# Patient Record
Sex: Male | Born: 1950 | Race: White | Hispanic: No | Marital: Married | Smoking: Former smoker
Health system: Southern US, Community
[De-identification: ages and names within clinical notes are randomized; demographics above are authoritative.]

## PROBLEM LIST (undated history)

## (undated) DIAGNOSIS — N179 Acute kidney failure, unspecified: Secondary | ICD-10-CM

## (undated) DIAGNOSIS — E079 Disorder of thyroid, unspecified: Secondary | ICD-10-CM

## (undated) DIAGNOSIS — M1990 Unspecified osteoarthritis, unspecified site: Secondary | ICD-10-CM

## (undated) DIAGNOSIS — T7840XA Allergy, unspecified, initial encounter: Secondary | ICD-10-CM

## (undated) DIAGNOSIS — R112 Nausea with vomiting, unspecified: Secondary | ICD-10-CM

## (undated) DIAGNOSIS — N289 Disorder of kidney and ureter, unspecified: Secondary | ICD-10-CM

## (undated) DIAGNOSIS — I10 Essential (primary) hypertension: Secondary | ICD-10-CM

## (undated) DIAGNOSIS — J45909 Unspecified asthma, uncomplicated: Secondary | ICD-10-CM

## (undated) DIAGNOSIS — E039 Hypothyroidism, unspecified: Secondary | ICD-10-CM

## (undated) DIAGNOSIS — G510 Bell's palsy: Secondary | ICD-10-CM

## (undated) DIAGNOSIS — T8859XA Other complications of anesthesia, initial encounter: Secondary | ICD-10-CM

## (undated) DIAGNOSIS — E119 Type 2 diabetes mellitus without complications: Secondary | ICD-10-CM

## (undated) DIAGNOSIS — Z87442 Personal history of urinary calculi: Secondary | ICD-10-CM

## (undated) DIAGNOSIS — Z8673 Personal history of transient ischemic attack (TIA), and cerebral infarction without residual deficits: Secondary | ICD-10-CM

## (undated) DIAGNOSIS — E785 Hyperlipidemia, unspecified: Secondary | ICD-10-CM

## (undated) DIAGNOSIS — Z9889 Other specified postprocedural states: Secondary | ICD-10-CM

## (undated) DIAGNOSIS — Z720 Tobacco use: Secondary | ICD-10-CM

## (undated) DIAGNOSIS — T4145XA Adverse effect of unspecified anesthetic, initial encounter: Secondary | ICD-10-CM

## (undated) HISTORY — DX: Hyperlipidemia, unspecified: E78.5

## (undated) HISTORY — PX: CHOLECYSTECTOMY: SHX55

## (undated) HISTORY — PX: CIRCUMCISION: SUR203

## (undated) HISTORY — DX: Unspecified osteoarthritis, unspecified site: M19.90

## (undated) HISTORY — PX: COLONOSCOPY: SHX174

## (undated) HISTORY — DX: Allergy, unspecified, initial encounter: T78.40XA

## (undated) HISTORY — DX: Personal history of transient ischemic attack (TIA), and cerebral infarction without residual deficits: Z86.73

## (undated) HISTORY — DX: Tobacco use: Z72.0

## (undated) HISTORY — DX: Unspecified asthma, uncomplicated: J45.909

## (undated) HISTORY — DX: Acute kidney failure, unspecified: N17.9

---

## 2014-10-22 ENCOUNTER — Inpatient Hospital Stay (HOSPITAL_COMMUNITY): Payer: BLUE CROSS/BLUE SHIELD

## 2014-10-22 ENCOUNTER — Inpatient Hospital Stay (HOSPITAL_COMMUNITY)
Admission: EM | Admit: 2014-10-22 | Discharge: 2014-10-24 | DRG: 637 | Disposition: A | Discharge status: Home / Self Care | Payer: BLUE CROSS/BLUE SHIELD | Attending: Internal Medicine | Admitting: Internal Medicine

## 2014-10-22 ENCOUNTER — Encounter (HOSPITAL_COMMUNITY): Payer: Self-pay | Admitting: *Deleted

## 2014-10-22 ENCOUNTER — Encounter (HOSPITAL_COMMUNITY): Payer: BLUE CROSS/BLUE SHIELD

## 2014-10-22 ENCOUNTER — Emergency Department (HOSPITAL_COMMUNITY): Payer: BLUE CROSS/BLUE SHIELD

## 2014-10-22 DIAGNOSIS — E871 Hypo-osmolality and hyponatremia: Secondary | ICD-10-CM | POA: Diagnosis present

## 2014-10-22 DIAGNOSIS — E1310 Other specified diabetes mellitus with ketoacidosis without coma: Secondary | ICD-10-CM | POA: Diagnosis present

## 2014-10-22 DIAGNOSIS — E875 Hyperkalemia: Secondary | ICD-10-CM | POA: Diagnosis present

## 2014-10-22 DIAGNOSIS — E0810 Diabetes mellitus due to underlying condition with ketoacidosis without coma: Secondary | ICD-10-CM

## 2014-10-22 DIAGNOSIS — E872 Acidosis, unspecified: Secondary | ICD-10-CM | POA: Insufficient documentation

## 2014-10-22 DIAGNOSIS — Z794 Long term (current) use of insulin: Secondary | ICD-10-CM

## 2014-10-22 DIAGNOSIS — N179 Acute kidney failure, unspecified: Secondary | ICD-10-CM

## 2014-10-22 DIAGNOSIS — E1110 Type 2 diabetes mellitus with ketoacidosis without coma: Secondary | ICD-10-CM | POA: Diagnosis present

## 2014-10-22 DIAGNOSIS — R739 Hyperglycemia, unspecified: Secondary | ICD-10-CM | POA: Diagnosis present

## 2014-10-22 DIAGNOSIS — Z87891 Personal history of nicotine dependence: Secondary | ICD-10-CM | POA: Diagnosis not present

## 2014-10-22 DIAGNOSIS — D473 Essential (hemorrhagic) thrombocythemia: Secondary | ICD-10-CM | POA: Diagnosis present

## 2014-10-22 DIAGNOSIS — D75839 Thrombocytosis, unspecified: Secondary | ICD-10-CM

## 2014-10-22 DIAGNOSIS — R748 Abnormal levels of other serum enzymes: Secondary | ICD-10-CM

## 2014-10-22 DIAGNOSIS — R059 Cough, unspecified: Secondary | ICD-10-CM | POA: Diagnosis present

## 2014-10-22 DIAGNOSIS — D72829 Elevated white blood cell count, unspecified: Secondary | ICD-10-CM

## 2014-10-22 DIAGNOSIS — R17 Unspecified jaundice: Secondary | ICD-10-CM

## 2014-10-22 DIAGNOSIS — R0602 Shortness of breath: Secondary | ICD-10-CM

## 2014-10-22 DIAGNOSIS — J189 Pneumonia, unspecified organism: Secondary | ICD-10-CM | POA: Diagnosis present

## 2014-10-22 DIAGNOSIS — R05 Cough: Secondary | ICD-10-CM | POA: Diagnosis present

## 2014-10-22 HISTORY — DX: Disorder of kidney and ureter, unspecified: N28.9

## 2014-10-22 HISTORY — DX: Disorder of thyroid, unspecified: E07.9

## 2014-10-22 HISTORY — DX: Type 2 diabetes mellitus without complications: E11.9

## 2014-10-22 HISTORY — DX: Acute kidney failure, unspecified: N17.9

## 2014-10-22 LAB — COMPREHENSIVE METABOLIC PANEL
ALK PHOS: 176 U/L — AB (ref 39–117)
ALT: 13 U/L (ref 0–53)
AST: 16 U/L (ref 0–37)
Albumin: 3.7 g/dL (ref 3.5–5.2)
Anion gap: 35 — ABNORMAL HIGH (ref 5–15)
BUN: 55 mg/dL — AB (ref 6–23)
CHLORIDE: 87 mmol/L — AB (ref 96–112)
CO2: 6 mmol/L — CL (ref 19–32)
CREATININE: 2.86 mg/dL — AB (ref 0.50–1.35)
Calcium: 8.8 mg/dL (ref 8.4–10.5)
GFR calc Af Amer: 26 mL/min — ABNORMAL LOW (ref >90)
GFR calc non Af Amer: 22 mL/min — ABNORMAL LOW (ref >90)
Glucose, Bld: 1092 mg/dL (ref 70–99)
POTASSIUM: 5.8 mmol/L — AB (ref 3.5–5.1)
SODIUM: 128 mmol/L — AB (ref 135–145)
TOTAL PROTEIN: 8.4 g/dL — AB (ref 6.0–8.3)
Total Bilirubin: 2.1 mg/dL — ABNORMAL HIGH (ref 0.3–1.2)

## 2014-10-22 LAB — URINE MICROSCOPIC-ADD ON

## 2014-10-22 LAB — CBC
HCT: 28.5 % — ABNORMAL LOW (ref 39.0–52.0)
HEMATOCRIT: 37.2 % — AB (ref 39.0–52.0)
HEMOGLOBIN: 9.9 g/dL — AB (ref 13.0–17.0)
Hemoglobin: 13.3 g/dL (ref 13.0–17.0)
MCH: 33.7 pg (ref 26.0–34.0)
MCH: 30.9 pg (ref 26.0–34.0)
MCHC: 35.8 g/dL (ref 30.0–36.0)
MCHC: 34.7 g/dL (ref 30.0–36.0)
MCV: 94.2 fL (ref 78.0–100.0)
MCV: 89.1 fL (ref 78.0–100.0)
PLATELETS: 305 10*3/uL (ref 150–400)
Platelets: 394 10*3/uL (ref 150–400)
RBC: 3.95 MIL/uL — ABNORMAL LOW (ref 4.22–5.81)
RBC: 3.2 MIL/uL — AB (ref 4.22–5.81)
RDW: 12.5 % (ref 11.5–15.5)
RDW: 12.6 % (ref 11.5–15.5)
WBC: 24.5 10*3/uL — ABNORMAL HIGH (ref 4.0–10.5)
WBC: 18.9 10*3/uL — AB (ref 4.0–10.5)

## 2014-10-22 LAB — URINALYSIS, ROUTINE W REFLEX MICROSCOPIC
Glucose, UA: >1000 mg/dL — AB
KETONES UR: 40 mg/dL — AB
Leukocytes, UA: NEGATIVE
Nitrite: NEGATIVE
Specific Gravity, Urine: 1.025 (ref 1.005–1.030)
Urobilinogen, UA: 0.2 mg/dL (ref 0.0–1.0)
pH: 5.5 (ref 5.0–8.0)

## 2014-10-22 LAB — BASIC METABOLIC PANEL
ANION GAP: 6 (ref 5–15)
ANION GAP: 3 — AB (ref 5–15)
Anion gap: 25 — ABNORMAL HIGH (ref 5–15)
Anion gap: 17 — ABNORMAL HIGH (ref 5–15)
Anion gap: 2 — ABNORMAL LOW (ref 5–15)
BUN: 51 mg/dL — AB (ref 6–23)
BUN: 50 mg/dL — ABNORMAL HIGH (ref 6–23)
BUN: 46 mg/dL — ABNORMAL HIGH (ref 6–23)
BUN: 40 mg/dL — AB (ref 6–23)
BUN: 31 mg/dL — ABNORMAL HIGH (ref 6–23)
CALCIUM: 8.2 mg/dL — AB (ref 8.4–10.5)
CALCIUM: 8 mg/dL — AB (ref 8.4–10.5)
CHLORIDE: 119 mmol/L — AB (ref 96–112)
CHLORIDE: 119 mmol/L — AB (ref 96–112)
CO2: 6 mmol/L — CL (ref 19–32)
CO2: 11 mmol/L — ABNORMAL LOW (ref 19–32)
CO2: 18 mmol/L — ABNORMAL LOW (ref 19–32)
CO2: 21 mmol/L (ref 19–32)
CO2: 18 mmol/L — ABNORMAL LOW (ref 19–32)
Calcium: 7.9 mg/dL — ABNORMAL LOW (ref 8.4–10.5)
Calcium: 8.2 mg/dL — ABNORMAL LOW (ref 8.4–10.5)
Calcium: 6.8 mg/dL — ABNORMAL LOW (ref 8.4–10.5)
Chloride: 106 mmol/L (ref 96–112)
Chloride: 111 mmol/L (ref 96–112)
Chloride: 121 mmol/L — ABNORMAL HIGH (ref 96–112)
Creatinine, Ser: 2.31 mg/dL — ABNORMAL HIGH (ref 0.50–1.35)
Creatinine, Ser: 1.97 mg/dL — ABNORMAL HIGH (ref 0.50–1.35)
Creatinine, Ser: 1.34 mg/dL (ref 0.50–1.35)
Creatinine, Ser: 1.05 mg/dL (ref 0.50–1.35)
Creatinine, Ser: 0.83 mg/dL (ref 0.50–1.35)
GFR calc Af Amer: 33 mL/min — ABNORMAL LOW (ref >90)
GFR calc Af Amer: 40 mL/min — ABNORMAL LOW (ref >90)
GFR calc Af Amer: 64 mL/min — ABNORMAL LOW (ref >90)
GFR calc Af Amer: 86 mL/min — ABNORMAL LOW (ref >90)
GFR calc non Af Amer: 29 mL/min — ABNORMAL LOW (ref >90)
GFR calc non Af Amer: 55 mL/min — ABNORMAL LOW (ref >90)
GFR calc non Af Amer: 74 mL/min — ABNORMAL LOW (ref >90)
GFR calc non Af Amer: >90 mL/min (ref >90)
GFR, EST NON AFRICAN AMERICAN: 35 mL/min — AB (ref >90)
GLUCOSE: 537 mg/dL — AB (ref 70–99)
GLUCOSE: 301 mg/dL — AB (ref 70–99)
Glucose, Bld: 765 mg/dL (ref 70–99)
Glucose, Bld: 157 mg/dL — ABNORMAL HIGH (ref 70–99)
Glucose, Bld: 106 mg/dL — ABNORMAL HIGH (ref 70–99)
POTASSIUM: 4.7 mmol/L (ref 3.5–5.1)
POTASSIUM: 3.9 mmol/L (ref 3.5–5.1)
POTASSIUM: 3.6 mmol/L (ref 3.5–5.1)
Potassium: 3.5 mmol/L (ref 3.5–5.1)
Potassium: 3.5 mmol/L (ref 3.5–5.1)
Sodium: 137 mmol/L (ref 135–145)
Sodium: 139 mmol/L (ref 135–145)
Sodium: 143 mmol/L (ref 135–145)
Sodium: 142 mmol/L (ref 135–145)
Sodium: 142 mmol/L (ref 135–145)

## 2014-10-22 LAB — MAGNESIUM
MAGNESIUM: 3.4 mg/dL — AB (ref 1.5–2.5)
MAGNESIUM: 2.7 mg/dL — AB (ref 1.5–2.5)

## 2014-10-22 LAB — BLOOD GAS, ARTERIAL
ACID-BASE DEFICIT: 26.5 mmol/L — AB (ref 0.0–2.0)
Bicarbonate: 2.9 mEq/L — ABNORMAL LOW (ref 20.0–24.0)
DRAWN BY: 213101
O2 Content: 2 L/min
O2 Saturation: 96.5 %
PH ART: 6.991 — AB (ref 7.350–7.450)
Patient temperature: 37
TCO2: 2.9 mmol/L (ref 0–100)
pCO2 arterial: 12.8 mmHg — CL (ref 35.0–45.0)
pO2, Arterial: 135 mmHg — ABNORMAL HIGH (ref 80.0–100.0)

## 2014-10-22 LAB — GLUCOSE, CAPILLARY
GLUCOSE-CAPILLARY: 469 mg/dL — AB (ref 70–99)
GLUCOSE-CAPILLARY: 451 mg/dL — AB (ref 70–99)
GLUCOSE-CAPILLARY: 195 mg/dL — AB (ref 70–99)
Glucose-Capillary: >600 mg/dL (ref 70–99)
Glucose-Capillary: 477 mg/dL — ABNORMAL HIGH (ref 70–99)
Glucose-Capillary: 331 mg/dL — ABNORMAL HIGH (ref 70–99)
Glucose-Capillary: 288 mg/dL — ABNORMAL HIGH (ref 70–99)
Glucose-Capillary: 230 mg/dL — ABNORMAL HIGH (ref 70–99)
Glucose-Capillary: 169 mg/dL — ABNORMAL HIGH (ref 70–99)
Glucose-Capillary: 164 mg/dL — ABNORMAL HIGH (ref 70–99)
Glucose-Capillary: 160 mg/dL — ABNORMAL HIGH (ref 70–99)
Glucose-Capillary: 124 mg/dL — ABNORMAL HIGH (ref 70–99)

## 2014-10-22 LAB — CBC WITH DIFFERENTIAL/PLATELET
BASOS ABS: 0 10*3/uL (ref 0.0–0.1)
Band Neutrophils: 0 % (ref 0–10)
Basophils Relative: 0 % (ref 0–1)
Blasts: 0 %
Eosinophils Absolute: 0 10*3/uL (ref 0.0–0.7)
Eosinophils Relative: 0 % (ref 0–5)
HEMATOCRIT: 46.5 % (ref 39.0–52.0)
Hemoglobin: 14.5 g/dL (ref 13.0–17.0)
LYMPHS ABS: 2 10*3/uL (ref 0.7–4.0)
Lymphocytes Relative: 8 % — ABNORMAL LOW (ref 12–46)
MCH: 32.8 pg (ref 26.0–34.0)
MCHC: 31.2 g/dL (ref 30.0–36.0)
MCV: 105.2 fL — ABNORMAL HIGH (ref 78.0–100.0)
MYELOCYTES: 0 %
Metamyelocytes Relative: 0 %
Monocytes Absolute: 2.3 10*3/uL — ABNORMAL HIGH (ref 0.1–1.0)
Monocytes Relative: 9 % (ref 3–12)
Neutro Abs: 21.1 10*3/uL — ABNORMAL HIGH (ref 1.7–7.7)
Neutrophils Relative %: 83 % — ABNORMAL HIGH (ref 43–77)
Platelets: 581 10*3/uL — ABNORMAL HIGH (ref 150–400)
Promyelocytes Absolute: 0 %
RBC: 4.42 MIL/uL (ref 4.22–5.81)
RDW: 13.1 % (ref 11.5–15.5)
WBC: 25.4 10*3/uL — AB (ref 4.0–10.5)
nRBC: 0 /100 WBC

## 2014-10-22 LAB — PHOSPHORUS: PHOSPHORUS: 11.2 mg/dL — AB (ref 2.3–4.6)

## 2014-10-22 LAB — INFLUENZA PANEL BY PCR (TYPE A & B)
H1N1 flu by pcr: NOT DETECTED
INFLBPCR: NEGATIVE
Influenza A By PCR: NEGATIVE

## 2014-10-22 LAB — CBG MONITORING, ED: Glucose-Capillary: >600 mg/dL (ref 70–99)

## 2014-10-22 LAB — TROPONIN I
Troponin I: <0.03 ng/mL (ref <0.031)
Troponin I: 0.12 ng/mL — ABNORMAL HIGH (ref <0.031)

## 2014-10-22 LAB — LACTIC ACID, PLASMA: LACTIC ACID, VENOUS: 6 mmol/L — AB (ref 0.5–2.0)

## 2014-10-22 LAB — LIPASE, BLOOD: Lipase: 37 U/L (ref 11–59)

## 2014-10-22 LAB — TYPE AND SCREEN
ABO/RH(D): O POS
ANTIBODY SCREEN: NEGATIVE

## 2014-10-22 LAB — MRSA PCR SCREENING: MRSA by PCR: NEGATIVE

## 2014-10-22 MED ORDER — ONDANSETRON HCL 4 MG/2ML IJ SOLN: 8.0000 mg | Freq: Four times a day (QID) | INTRAMUSCULAR | Status: DC | PRN

## 2014-10-22 MED ORDER — SODIUM CHLORIDE 0.9 % IV SOLN
80.0000 mg | Freq: Once | INTRAVENOUS | Status: AC
  Administered 2014-10-22: 80 mg via INTRAVENOUS
  Filled 2014-10-22: qty 80

## 2014-10-22 MED ORDER — PANTOPRAZOLE SODIUM 40 MG IV SOLR
INTRAVENOUS | Status: AC
Start: 2014-10-22 — End: 2014-10-22
  Filled 2014-10-22: qty 80

## 2014-10-22 MED ORDER — ENOXAPARIN SODIUM 30 MG/0.3ML ~~LOC~~ SOLN
30.0000 mg | SUBCUTANEOUS | Status: DC
  Administered 2014-10-22 – 2014-10-23 (×2): 30 mg via SUBCUTANEOUS
  Filled 2014-10-22 (×2): qty 0.3

## 2014-10-22 MED ORDER — SODIUM CHLORIDE 0.9 % IJ SOLN
10.0000 mL | Freq: Two times a day (BID) | INTRAMUSCULAR | Status: DC
  Administered 2014-10-22 – 2014-10-23 (×2): 10 mL
  Administered 2014-10-23: 20 mL

## 2014-10-22 MED ORDER — LEVOTHYROXINE SODIUM 25 MCG PO TABS
50.0000 ug | ORAL_TABLET | Freq: Every day | ORAL | Status: DC
  Administered 2014-10-22 – 2014-10-24 (×3): 50 ug via ORAL
  Filled 2014-10-22 (×3): qty 2

## 2014-10-22 MED ORDER — SODIUM CHLORIDE 0.9 % IV SOLN
INTRAVENOUS | Status: DC
  Administered 2014-10-22: 22:00:00 via INTRAVENOUS

## 2014-10-22 MED ORDER — SODIUM CHLORIDE 0.9 % IJ SOLN
10.0000 mL | INTRAMUSCULAR | Status: DC | PRN
  Administered 2014-10-22: 40 mL
  Filled 2014-10-22: qty 40

## 2014-10-22 MED ORDER — DILTIAZEM LOAD VIA INFUSION
10.0000 mg | Freq: Once | INTRAVENOUS | Status: AC
  Administered 2014-10-22: 10 mg via INTRAVENOUS
  Filled 2014-10-22: qty 10

## 2014-10-22 MED ORDER — SODIUM CHLORIDE 0.9 % IV SOLN
8.0000 mg/h | INTRAVENOUS | Status: DC
  Administered 2014-10-22 (×3): 8 mg/h via INTRAVENOUS
  Filled 2014-10-22 (×4): qty 80

## 2014-10-22 MED ORDER — DEXTROSE 5 % IV SOLN
INTRAVENOUS | Status: AC
  Filled 2014-10-22: qty 10

## 2014-10-22 MED ORDER — CEFTRIAXONE SODIUM IN DEXTROSE 20 MG/ML IV SOLN
1.0000 g | INTRAVENOUS | Status: DC
  Administered 2014-10-22 – 2014-10-23 (×2): 1 g via INTRAVENOUS
  Filled 2014-10-22 (×3): qty 50

## 2014-10-22 MED ORDER — PANTOPRAZOLE SODIUM 40 MG IV SOLR
INTRAVENOUS | Status: AC
  Filled 2014-10-22: qty 80

## 2014-10-22 MED ORDER — LEVOFLOXACIN IN D5W 750 MG/150ML IV SOLN
750.0000 mg | INTRAVENOUS | Status: DC
  Administered 2014-10-22: 750 mg via INTRAVENOUS
  Filled 2014-10-22: qty 150

## 2014-10-22 MED ORDER — PANTOPRAZOLE SODIUM 40 MG IV SOLR: 40.0000 mg | Freq: Two times a day (BID) | INTRAVENOUS | Status: DC

## 2014-10-22 MED ORDER — SODIUM CHLORIDE 0.9 % IV SOLN: INTRAVENOUS | Status: AC

## 2014-10-22 MED ORDER — ONDANSETRON 8 MG/NS 50 ML IVPB
8.0000 mg | Freq: Four times a day (QID) | INTRAVENOUS | Status: DC | PRN
Start: 2014-10-22 — End: 2014-10-24
  Filled 2014-10-22: qty 8

## 2014-10-22 MED ORDER — SODIUM CHLORIDE 0.9 % IV BOLUS (SEPSIS)
1000.0000 mL | Freq: Once | INTRAVENOUS | Status: AC
  Administered 2014-10-22: 1000 mL via INTRAVENOUS

## 2014-10-22 MED ORDER — SODIUM CHLORIDE 0.9 % IV SOLN: INTRAVENOUS | Status: DC

## 2014-10-22 MED ORDER — DEXTROSE 5 % IV SOLN
500.0000 mg | INTRAVENOUS | Status: DC
  Administered 2014-10-22 – 2014-10-23 (×2): 500 mg via INTRAVENOUS
  Filled 2014-10-22 (×3): qty 500

## 2014-10-22 MED ORDER — SODIUM CHLORIDE 0.9 % IV SOLN
1000.0000 mL | INTRAVENOUS | Status: DC
  Administered 2014-10-22: 1000 mL via INTRAVENOUS

## 2014-10-22 MED ORDER — OSELTAMIVIR PHOSPHATE 30 MG PO CAPS
30.0000 mg | ORAL_CAPSULE | Freq: Two times a day (BID) | ORAL | Status: DC
  Administered 2014-10-22 (×2): 30 mg via ORAL
  Filled 2014-10-22 (×2): qty 1

## 2014-10-22 MED ORDER — DEXTROSE 5 % IV SOLN
INTRAVENOUS | Status: DC
  Administered 2014-10-22: 16:00:00 via INTRAVENOUS

## 2014-10-22 MED ORDER — SODIUM CHLORIDE 0.9 % IV SOLN
INTRAVENOUS | Status: DC
  Filled 2014-10-22: qty 2.5

## 2014-10-22 MED ORDER — SODIUM CHLORIDE 0.9 % IV SOLN
INTRAVENOUS | Status: DC
  Administered 2014-10-22: 08:00:00 via INTRAVENOUS

## 2014-10-22 MED ORDER — DEXTROSE-NACL 5-0.45 % IV SOLN
INTRAVENOUS | Status: DC
  Administered 2014-10-22: 15:00:00 via INTRAVENOUS

## 2014-10-22 MED ORDER — INSULIN GLARGINE 100 UNIT/ML ~~LOC~~ SOLN
15.0000 [IU] | Freq: Every day | SUBCUTANEOUS | Status: DC
  Administered 2014-10-22 – 2014-10-23 (×2): 15 [IU] via SUBCUTANEOUS
  Filled 2014-10-22 (×2): qty 0.15

## 2014-10-22 MED ORDER — SODIUM CHLORIDE 0.9 % IV SOLN
1000.0000 mL | Freq: Once | INTRAVENOUS | Status: AC
  Administered 2014-10-22: 1000 mL via INTRAVENOUS

## 2014-10-22 MED ORDER — SODIUM CHLORIDE 0.9 % IV BOLUS (SEPSIS)
500.0000 mL | Freq: Once | INTRAVENOUS | Status: AC
  Administered 2014-10-22: 500 mL via INTRAVENOUS

## 2014-10-22 MED ORDER — POTASSIUM CHLORIDE 10 MEQ/100ML IV SOLN
10.0000 meq | INTRAVENOUS | Status: AC
  Administered 2014-10-22 (×5): 10 meq via INTRAVENOUS
  Filled 2014-10-22: qty 100

## 2014-10-22 MED ORDER — SODIUM CHLORIDE 0.9 % IV SOLN
INTRAVENOUS | Status: DC
  Administered 2014-10-22: 5.4 [IU]/h via INTRAVENOUS
  Administered 2014-10-22: 16.2 [IU]/h via INTRAVENOUS
  Filled 2014-10-22: qty 2.5

## 2014-10-22 MED ORDER — INSULIN ASPART 100 UNIT/ML ~~LOC~~ SOLN
0.0000 [IU] | SUBCUTANEOUS | Status: DC
  Administered 2014-10-22: 2 [IU] via SUBCUTANEOUS
  Administered 2014-10-23: 3 [IU] via SUBCUTANEOUS

## 2014-10-22 MED ORDER — SODIUM POLYSTYRENE SULFONATE 15 GM/60ML PO SUSP: 15.0000 g | Freq: Once | ORAL | Status: DC

## 2014-10-22 MED ORDER — INSULIN GLARGINE 100 UNIT/ML ~~LOC~~ SOLN: 15.0000 [IU] | Freq: Every day | SUBCUTANEOUS | Status: DC

## 2014-10-22 MED ORDER — CHLORHEXIDINE GLUCONATE 0.12 % MT SOLN
15.0000 mL | Freq: Two times a day (BID) | OROMUCOSAL | Status: DC
  Administered 2014-10-22 – 2014-10-23 (×4): 15 mL via OROMUCOSAL
  Filled 2014-10-22 (×4): qty 15

## 2014-10-22 MED ORDER — DEXTROSE 5 % IV SOLN
INTRAVENOUS | Status: AC
  Filled 2014-10-22: qty 500

## 2014-10-22 MED ORDER — DILTIAZEM HCL 100 MG IV SOLR
5.0000 mg/h | INTRAVENOUS | Status: DC
  Administered 2014-10-22 (×3): 10 mg/h via INTRAVENOUS

## 2014-10-22 MED ORDER — POTASSIUM CHLORIDE 10 MEQ/100ML IV SOLN
INTRAVENOUS | Status: AC
  Filled 2014-10-22: qty 500

## 2014-10-22 MED ORDER — SODIUM CHLORIDE 0.9 % IV SOLN
1000.0000 mL | Freq: Once | INTRAVENOUS | Status: AC
Start: 2014-10-22 — End: 2014-10-22
  Administered 2014-10-22: 1000 mL via INTRAVENOUS

## 2014-10-22 MED ORDER — CETYLPYRIDINIUM CHLORIDE 0.05 % MT LIQD
7.0000 mL | Freq: Two times a day (BID) | OROMUCOSAL | Status: DC
  Administered 2014-10-22 – 2014-10-23 (×3): 7 mL via OROMUCOSAL

## 2014-10-22 MED ORDER — ONDANSETRON HCL 4 MG/2ML IJ SOLN
INTRAMUSCULAR | Status: AC
  Administered 2014-10-22: 4 mg
  Filled 2014-10-22: qty 2

## 2014-10-22 NOTE — Progress Notes (Addendum)
[facility] PROGRESS NOTE  Filed Weights   10/22/14 0346 10/22/14 0713  Weight: 88.451 kg (195 lb) 85.7 kg (188 lb 15 oz)        Intake/Output Summary (Last 24 hours) at 10/22/14 0827 Last data filed at 10/22/14 6503  Gross per 24 hour  Intake      0 ml  Output    490 ml  Net   -490 ml     Assessment/Plan: DKA (diabetic ketoacidoses) - Blood glucose continues to be greater than 600, will continue IV insulin, decrease his IV fluids. - Still has an anion gap and his bicarbonate is 6, with no improvement from yesterday. - Continue IV fluids at 75, continue monitor strict I's and O's. - Once his blood glucoses below 250 which change solution to D5 half-normal saline.  Sinus tachycardia: - Has not improved with hydration. I'll give her another liter of normal saline again.  - If no improvement will start Diltiazem drip.  - Chest x-ray in the morning, insert triple-lumen PICC line.  Acute kidney injury: - Most likely prerenal. Continue IV fluids. - Check a basic metabolic panel in the morning. - Has had about 500 mL of urine output.  Cough/leukocytosis - Has remained afebrile, was given Levaquin on admission will change him to rocephin and azithro. Check CXR now that he is hydrated., concern about CAP with history of SOB and productive cough. - Check an influenza PCR started empirically intact through.  Hyperkalemia - DC bicarbonate push, continue IV fluids. - DC Kayexalate, his hyperkalemia is most likely due to acidosis. It seems to be correcting with hydration. - We'll need to monitor closely.   Hyponatremia: - Pseudohyponatremia due to hyperglycemia.  Nausea and vomiting question coffee-ground: -  Had an episode of vomiting here in the hospital, he was started on Zofran. He appears to be black.  - He denies any NSAIDs use except for an aspirin 81 mg.  - Started empirically on admission on Protonix infusion.  - We'll check CBCs every 12 hours and monitor  stools.  - Inserted and additional peripheral IV line.     Code Status: full Family Communication: none  Disposition Plan: inpatient   Consultants:  none  Procedures: ECHO: none  Antibiotics:  tamiflu  HPI/Subjective: He relates he feels weak, no improvement from yesterday.  Objective: Filed Vitals:   10/22/14 0713 10/22/14 0715 10/22/14 0730 10/22/14 0825  BP: 116/58 123/58 135/62   Pulse: 122 121 125   Temp:    97.7 F (36.5 C)  TempSrc:    Axillary  Resp:  20 17   Height: 5\' 9"  (1.753 m)     Weight: 85.7 kg (188 lb 15 oz)     SpO2: 99% 99% 99%      Exam:  General: Alert, awake, oriented x3, in no acute distress.  HEENT: No bruits, no goiter.  Heart: Regular rate and rhythm.  Lungs: Good air movement, clear Abdomen: Soft, nontender, nondistended, positive bowel sounds.  Neuro: Grossly intact, nonfocal.   Data Reviewed: Basic Metabolic Panel:  Recent Labs Lab 10/22/14 0410 10/22/14 0727  NA 128* 137  K 5.8* 4.7  CL 87* 106  CO2 6* 6*  GLUCOSE 1092* 765*  BUN 55* 51*  CREATININE 2.86* 2.31*  CALCIUM 8.8 7.9*  MG 3.4* 2.7*  PHOS 11.2*  --    Liver Function Tests:  Recent Labs Lab 10/22/14 0410  AST 16  ALT 13  ALKPHOS 176*  BILITOT 2.1*  PROT  8.4*  ALBUMIN 3.7    Recent Labs Lab 10/22/14 0410  LIPASE 37   No results for input(s): AMMONIA in the last 168 hours. CBC:  Recent Labs Lab 10/22/14 0410  WBC 25.4*  NEUTROABS 21.1*  HGB 14.5  HCT 46.5  MCV 105.2*  PLT 581*   Cardiac Enzymes:  Recent Labs Lab 10/22/14 0410  TROPONINI <0.03   BNP (last 3 results) No results for input(s): PROBNP in the last 8760 hours. CBG:  Recent Labs Lab 10/22/14 0355 10/22/14 0558 10/22/14 0704 10/22/14 0809  GLUCAP >600* >600* >600* >600*    No results found for this or any previous visit (from the past 240 hour(s)).   Studies: Dg Chest Port 1 View  10/22/2014   CLINICAL DATA:  Weakness, shortness of breath, fever, and  altered mental status.  EXAM: PORTABLE CHEST - 1 VIEW  COMPARISON:  None.  FINDINGS: Borderline heart size with normal pulmonary vascularity. No focal airspace disease or consolidation in the lungs. No blunting of costophrenic angles. No pneumothorax. Mediastinal contours appear intact. Calcified aorta.  IMPRESSION: Borderline heart size.  No evidence of active pulmonary disease.   Electronically Signed   By: Lucienne Capers M.D.   On: 10/22/2014 04:54    Scheduled Meds: . antiseptic oral rinse  7 mL Mouth Rinse q12n4p  . chlorhexidine  15 mL Mouth Rinse BID  . enoxaparin (LOVENOX) injection  30 mg Subcutaneous Q24H  . levothyroxine  50 mcg Oral QAC breakfast  . oseltamivir  75 mg Oral BID  . [START ON 10/25/2014] pantoprazole (PROTONIX) IV  40 mg Intravenous Q12H  . sodium polystyrene  15 g Oral Once   Continuous Infusions: . sodium chloride 1,000 mL (10/22/14 0517)  . sodium chloride    . sodium chloride 250 mL/hr at 10/22/14 0807  . dextrose 5 % and 0.45% NaCl    . insulin (NOVOLIN-R) infusion 16.2 Units/hr (10/22/14 0807)  . insulin (NOVOLIN-R) infusion    . pantoprozole (PROTONIX) infusion 8 mg/hr (10/22/14 0536)     Charlynne Cousins  [facility] Pager [phone]  If 8PM-8AM, please contact night-coverage at [email or web address], password [facility] 10/22/2014, 8:27 AM  LOS: 0 days

## 2014-10-22 NOTE — Progress Notes (Signed)
Pharmacy Note:  Tamiflu adjusted for renal fxn  Initial order for Tamiflu noted.    Estimated Creatinine Clearance: 36 mL/min (by C-G formula based on Cr of 2.31).   Allergies  Allergen Reactions  . Codeine   . Morphine And Related     Filed Vitals:   10/22/14 0825  BP:   Pulse:   Temp: 97.7 F (36.5 C)  Resp:     Anti-infectives    Start     Dose/Rate Route Frequency Ordered Stop   10/22/14 1000  oseltamivir (TAMIFLU) capsule 30 mg     30 mg Oral 2 times daily 10/22/14 0827 10/27/14 0959     Plan: Tamiflu 30mg  po BID x 5 days (adjusted for renal fxn) Monitor labs and progress  Ena Dawley, [facility] 10/22/2014 8:29 AM

## 2014-10-22 NOTE — Progress Notes (Signed)
ANTIBIOTIC CONSULT NOTE - INITIAL  Pharmacy Consult for Levaquin Indication: bronchitis  Allergies  Allergen Reactions  . Codeine   . Morphine And Related     Patient Measurements: Height: 5\' 9"  (175.3 cm) Weight: 188 lb 15 oz (85.7 kg) IBW/kg (Calculated) : 70.7  Vital Signs: Temp: 97.7 F (36.5 C) (01/31 0825) Temp Source: Axillary (01/31 0825) BP: 135/62 mmHg (01/31 0730) Pulse Rate: 125 (01/31 0730) Intake/Output from previous day: 01/30 0701 - 01/31 0700 In: -  Out: 490 [Urine:490] Intake/Output from this shift:    Labs:  Recent Labs  10/22/14 0410 10/22/14 0727  WBC 25.4*  --   HGB 14.5  --   PLT 581*  --   CREATININE 2.86* 2.31*   Estimated Creatinine Clearance: 36 mL/min (by C-G formula based on Cr of 2.31). No results for input(s): VANCOTROUGH, VANCOPEAK, VANCORANDOM, GENTTROUGH, GENTPEAK, GENTRANDOM, TOBRATROUGH, TOBRAPEAK, TOBRARND, AMIKACINPEAK, AMIKACINTROU, AMIKACIN in the last 72 hours.   Microbiology: Recent Results (from the past 720 hour(s))  Culture, blood (routine x 2)     Status: None (Preliminary result)   Collection Time: 10/22/14  4:53 AM  Result Value Ref Range Status   Specimen Description BLOOD LEFT ARM  Final   Special Requests BOTTLES DRAWN AEROBIC AND ANAEROBIC 6CC BOTTLES  Final   Culture PENDING  Incomplete   Report Status PENDING  Incomplete  Culture, blood (routine x 2)     Status: None (Preliminary result)   Collection Time: 10/22/14  5:11 AM  Result Value Ref Range Status   Specimen Description BLOOD LEFT ARM  Final   Special Requests BOTTLES DRAWN AEROBIC ONLY 6CC BOTTLE  Final   Culture PENDING  Incomplete   Report Status PENDING  Incomplete   Medical History: Past Medical History  Diagnosis Date  . Diabetes mellitus without complication   . Thyroid disease   . Renal disorder    Anti-infectives    Start     Dose/Rate Route Frequency Ordered Stop   10/22/14 1000  oseltamivir (TAMIFLU) capsule 30 mg     30 mg  Oral 2 times daily 10/22/14 0827 10/27/14 0959   10/22/14 1000  levofloxacin (LEVAQUIN) IVPB 750 mg     750 mg100 mL/hr over 90 Minutes Intravenous Every 48 hours 10/22/14 0855       Assessment: 63yo male admitted with bronchitis.  Asked to initiate Levaquin. Estimated Creatinine Clearance: 36 mL/min (by C-G formula based on Cr of 2.31).  Goal of Therapy:  Eradicate infection.  Plan:  Levaquin 750mg  IV q48hrs (renally adjusted) Monitor labs and progress.    [place] Crane, Gaylan Fauver A 10/22/2014,8:56 AM

## 2014-10-22 NOTE — ED Notes (Signed)
CRITICAL VALUE ALERT  Critical value received:  Lactic acid 6.0  Date of notification:  1/361/16  Time of notification:  0451  Critical value read back: yes  Nurse who received alert: Joyce Copa  MD notified (1st page): Roxanne Mins  Time of first page:  0451  MD notified (2nd page):  Time of second page:  Responding MD:  Roxanne Mins  Time MD responded:  [phone]

## 2014-10-22 NOTE — ED Notes (Signed)
Pt states he has been sob x 1wk. Pt lethargic and sleeping through triage.

## 2014-10-22 NOTE — ED Notes (Signed)
CRITICAL VALUE ALERT  Critical value received:  Glucose 1092 CO2 6  Date of notification:  10/22/14  Time of notification:  0451  Critical value read back: yes  Nurse who received alert:  Cheri Rous, RN  MD notified (1st page):  Roxanne Mins  Time of first page:  0451  MD notified (2nd page):  Time of second page:  Responding MD:  Roxanne Mins  Time MD responded:  [phone]

## 2014-10-22 NOTE — Progress Notes (Addendum)
Shift Event: Paged by RN to transition pt off Insulin Glucostabilizer, pt received SQ Lantus.  Upon record review, multiple CBGs in 100s, AG 2, CO2 21. Pt received 15U Lantus at 1630, 5 hours prior. Stopped Insulin gtt;  placed on SSI q 4; CBG monitoring q 4; Carb modified diet;  Kept fluids at D5 1/2 NS due to low CBGs, later switched to NS fluids at 34ml/hr  Corey Warren [facility] [phone]

## 2014-10-22 NOTE — ED Notes (Addendum)
Pt. Wife reports pt. Has not been feeling well for several days. Reports blood sugar of 198 at 2300 10/21/14. Reports giving pt. Lantus but not Novalog. Pt. Wife reports pt. With coffee ground emesis yesterday.

## 2014-10-22 NOTE — ED Notes (Signed)
Pt. More alert now. Pt. Given mouth swab to wet mouth. Pt. Asking "where am I?"

## 2014-10-22 NOTE — H&P (Signed)
Corey Warren is an 64 y.o. male.    Pcp: no local pcp [facility] physician, endocrinologist  Chief Complaint: hyperglycemia HPI: 64yo male with dm2, not taking his novolog apparently has had fever/cough since 3-4 days.  Pt is brought into ED and found to have dka and leukocytosis.    Past Medical History  Diagnosis Date  . Diabetes mellitus without complication   . Thyroid disease   . Renal disorder     Past Surgical History  Procedure Laterality Date  . Cholecystectomy      Family History  Problem Relation Age of Onset  . Family history unknown: Yes   Social History:  reports that he has quit smoking. He does not have any smokeless tobacco history on file. He reports that he does not drink alcohol or use illicit drugs.  Allergies:  Allergies  Allergen Reactions  . Codeine   . Morphine And Related    Medications reviewed  (Not in a hospital admission)  Results for orders placed or performed during the hospital encounter of 10/22/14 (from the past 48 hour(s))  CBG monitoring, ED     Status: Abnormal   Collection Time: 10/22/14  3:55 AM  Result Value Ref Range   Glucose-Capillary >600 (HH) 70 - 99 mg/dL  Comprehensive metabolic panel     Status: Abnormal   Collection Time: 10/22/14  4:10 AM  Result Value Ref Range   Sodium 128 (L) 135 - 145 mmol/L   Potassium 5.8 (H) 3.5 - 5.1 mmol/L   Chloride 87 (L) 96 - 112 mmol/L   CO2 6 (LL) 19 - 32 mmol/L    Comment: CRITICAL RESULT CALLED TO, READ BACK BY AND VERIFIED WITH:  KELLY,A @ 0451 ON 10/22/14 BY WOODIE,J    Glucose, Bld 1092 (HH) 70 - 99 mg/dL    Comment: CRITICAL RESULT CALLED TO, READ BACK BY AND VERIFIED WITH:  KELLY,A @ 0451 ON 10/22/14 BY WOODIE,J    BUN 55 (H) 6 - 23 mg/dL   Creatinine, Ser 2.86 (H) 0.50 - 1.35 mg/dL   Calcium 8.8 8.4 - 10.5 mg/dL   Total Protein 8.4 (H) 6.0 - 8.3 g/dL   Albumin 3.7 3.5 - 5.2 g/dL   AST 16 0 - 37 U/L   ALT 13 0 - 53 U/L   Alkaline Phosphatase 176 (H) 39 - 117 U/L   Total  Bilirubin 2.1 (H) 0.3 - 1.2 mg/dL   GFR calc non Af Amer 22 (L) >90 mL/min   GFR calc Af Amer 26 (L) >90 mL/min    Comment: (NOTE) The eGFR has been calculated using the CKD EPI equation. This calculation has not been validated in all clinical situations. eGFR's persistently <90 mL/min signify possible Chronic Kidney Disease.    Anion gap 35 (H) 5 - 15  Troponin I     Status: None   Collection Time: 10/22/14  4:10 AM  Result Value Ref Range   Troponin I <0.03 <0.031 ng/mL    Comment:        NO INDICATION OF MYOCARDIAL INJURY.   Lipase, blood     Status: None   Collection Time: 10/22/14  4:10 AM  Result Value Ref Range   Lipase 37 11 - 59 U/L  CBC with Differential     Status: Abnormal   Collection Time: 10/22/14  4:10 AM  Result Value Ref Range   WBC 25.4 (H) 4.0 - 10.5 K/uL   RBC 4.42 4.22 - 5.81 MIL/uL   Hemoglobin 14.5  13.0 - 17.0 g/dL   HCT 46.5 39.0 - 52.0 %   MCV 105.2 (H) 78.0 - 100.0 fL   MCH 32.8 26.0 - 34.0 pg   MCHC 31.2 30.0 - 36.0 g/dL   RDW 13.1 11.5 - 15.5 %   Platelets 581 (H) 150 - 400 K/uL   Neutrophils Relative % 83 (H) 43 - 77 %   Lymphocytes Relative 8 (L) 12 - 46 %   Monocytes Relative 9 3 - 12 %   Eosinophils Relative 0 0 - 5 %   Basophils Relative 0 0 - 1 %   Band Neutrophils 0 0 - 10 %   Metamyelocytes Relative 0 %   Myelocytes 0 %   Promyelocytes Absolute 0 %   Blasts 0 %   nRBC 0 0 /100 WBC   Neutro Abs 21.1 (H) 1.7 - 7.7 K/uL   Lymphs Abs 2.0 0.7 - 4.0 K/uL   Monocytes Absolute 2.3 (H) 0.1 - 1.0 K/uL   Eosinophils Absolute 0.0 0.0 - 0.7 K/uL   Basophils Absolute 0.0 0.0 - 0.1 K/uL   WBC Morphology ATYPICAL LYMPHOCYTES   Lactic acid, plasma     Status: Abnormal   Collection Time: 10/22/14  4:10 AM  Result Value Ref Range   Lactic Acid, Venous 6.0 (HH) 0.5 - 2.0 mmol/L    Comment: CRITICAL RESULT CALLED TO, READ BACK BY AND VERIFIED WITH:  KELLY,A @ 0450 ON 10/22/14 BY WOODIE,J   Magnesium     Status: Abnormal   Collection Time:  10/22/14  4:10 AM  Result Value Ref Range   Magnesium 3.4 (H) 1.5 - 2.5 mg/dL  Phosphorus     Status: Abnormal   Collection Time: 10/22/14  4:10 AM  Result Value Ref Range   Phosphorus 11.2 (H) 2.3 - 4.6 mg/dL  Urinalysis, Routine w reflex microscopic     Status: Abnormal   Collection Time: 10/22/14  4:28 AM  Result Value Ref Range   Color, Urine YELLOW YELLOW   APPearance CLEAR CLEAR   Specific Gravity, Urine 1.025 1.005 - 1.030   pH 5.5 5.0 - 8.0   Glucose, UA >1000 (A) NEGATIVE mg/dL   Hgb urine dipstick MODERATE (A) NEGATIVE   Bilirubin Urine SMALL (A) NEGATIVE   Ketones, ur 40 (A) NEGATIVE mg/dL   Protein, ur TRACE (A) NEGATIVE mg/dL   Urobilinogen, UA 0.2 0.0 - 1.0 mg/dL   Nitrite NEGATIVE NEGATIVE   Leukocytes, UA NEGATIVE NEGATIVE  Urine microscopic-add on     Status: None   Collection Time: 10/22/14  4:28 AM  Result Value Ref Range   Squamous Epithelial / LPF RARE RARE   WBC, UA 0-2 <3 WBC/hpf   RBC / HPF 0-2 <3 RBC/hpf   Bacteria, UA RARE RARE  Blood gas, arterial     Status: Abnormal   Collection Time: 10/22/14  4:30 AM  Result Value Ref Range   O2 Content 2.0 L/min   Delivery systems NASAL CANNULA    pH, Arterial 6.991 (LL) 7.350 - 7.450    Comment: CRITICAL RESULT CALLED TO, READ BACK BY AND VERIFIED WITH: KELLY,A.RN AT 0444 10/22/14 ANDERSON,S.RRT    pCO2 arterial 12.8 (LL) 35.0 - 45.0 mmHg    Comment: CRITICAL RESULT CALLED TO, READ BACK BY AND VERIFIED WITH: [facility] AT 0444 10/22/14 ANDERSON,S RRT    pO2, Arterial 135.0 (H) 80.0 - 100.0 mmHg   Bicarbonate 2.9 (L) 20.0 - 24.0 mEq/L   TCO2 2.9 0 - 100 mmol/L  Acid-base deficit 26.5 (H) 0.0 - 2.0 mmol/L   O2 Saturation 96.5 %   Patient temperature 37.0    Collection site RIGHT RADIAL    Drawn by [id number]    Sample type ARTERIAL    Allens test (pass/fail) PASS PASS   Dg Chest Port 1 View  10/22/2014   CLINICAL DATA:  Weakness, shortness of breath, fever, and altered mental status.  EXAM: PORTABLE  CHEST - 1 VIEW  COMPARISON:  None.  FINDINGS: Borderline heart size with normal pulmonary vascularity. No focal airspace disease or consolidation in the lungs. No blunting of costophrenic angles. No pneumothorax. Mediastinal contours appear intact. Calcified aorta.  IMPRESSION: Borderline heart size.  No evidence of active pulmonary disease.   Electronically Signed   By: Lucienne Capers M.D.   On: 10/22/2014 04:54    Review of Systems  Constitutional: Negative for fever, chills, weight loss, malaise/fatigue and diaphoresis.  HENT: Negative.   Eyes: Negative.   Respiratory: Negative.   Cardiovascular: Negative.   Gastrointestinal: Negative.   Genitourinary: Negative.   Musculoskeletal: Negative.   Skin: Negative.   Neurological: Negative.  Negative for weakness.  Endo/Heme/Allergies: Negative.   Psychiatric/Behavioral: Negative.     Blood pressure 112/57, pulse 120, temperature 97.3 F (36.3 C), temperature source Rectal, resp. rate 22, height 5' 9"  (1.753 m), weight 88.451 kg (195 lb), SpO2 100 %. Physical Exam  Constitutional: He is oriented to person, place, and time. He appears well-developed and well-nourished.  HENT:  Head: Normocephalic and atraumatic.  Eyes: Conjunctivae and EOM are normal. Pupils are equal, round, and reactive to light.  Neck: Normal range of motion. Neck supple. No JVD present. No tracheal deviation present. No thyromegaly present.  Cardiovascular: Normal rate and regular rhythm.  Exam reveals no gallop and no friction rub.   No murmur heard. Respiratory: Effort normal and breath sounds normal. No respiratory distress. He has no wheezes. He has no rales. He exhibits no tenderness.  GI: Soft. Bowel sounds are normal. He exhibits no distension and no mass. There is no tenderness. There is no rebound and no guarding.  Musculoskeletal: Normal range of motion. He exhibits no edema or tenderness.  Lymphadenopathy:    He has no cervical adenopathy.  Neurological:  He is alert and oriented to person, place, and time. He has normal reflexes. He displays normal reflexes. No cranial nerve deficit. He exhibits normal muscle tone. Coordination normal.  Skin: Skin is warm and dry. No rash noted. No erythema. No pallor.     Assessment/Plan Dka Ns iv Iv insulin  ARF Urine sodium, urine creatinine, urine eosinophils Renal ultrasound  Hyperkalemia Sodium bicarb kayexalate  Leukocytosis levaquin 55m iv qday pharmacy to dose  KJani Gravel1/31/2016, 5:51 AM

## 2014-10-22 NOTE — ED Provider Notes (Signed)
CSN: [id number]     Arrival date & time 10/22/14  5638 History   First MD Initiated Contact with Patient 10/22/14 0402     Chief Complaint  Patient presents with  . Shortness of Breath     (Consider location/radiation/quality/duration/timing/severity/associated sxs/prior Treatment) Patient is a 64 y.o. male presenting with shortness of breath. The history is provided by the spouse.  Shortness of Breath He has a history of poorly controlled insulin-dependent diabetes mellitus. For the last 3 days, he has run fevers which have been as high as 102. He started vomiting yesterday which got worse today. Today, emesis has been coffee-ground in nature. His mental state has waxed and waned but he got significantly less responsive tonight. Blood sugar was under 200 at home tonight. He has not had any fever or polyuria or polydipsia. He was not complaining of any pain. His wife has continued to give him his baseline Lantus insulin but did not give him any supplemental NovoLog because his blood sugar was not elevated.  Past Medical History  Diagnosis Date  . Diabetes mellitus without complication   . Thyroid disease   . Renal disorder    Past Surgical History  Procedure Laterality Date  . Cholecystectomy     History reviewed. No pertinent family history. History  Substance Use Topics  . Smoking status: Former Research scientist (life sciences)  . Smokeless tobacco: Not on file  . Alcohol Use: No    Review of Systems  Unable to perform ROS: Mental status change  Respiratory: Positive for shortness of breath.       Allergies  Codeine and Morphine and related  Home Medications   Prior to Admission medications   Medication Sig Start Date End Date Taking? Authorizing Provider  insulin aspart (NOVOLOG) 100 UNIT/ML injection Inject 15 Units into the skin 2 (two) times daily before a meal.   Yes Historical Provider, MD  insulin glargine (LANTUS) 100 UNIT/ML injection Inject 25 Units into the skin at bedtime.   Yes  Historical Provider, MD  levothyroxine (SYNTHROID, LEVOTHROID) 50 MCG tablet Take 50 mcg by mouth daily before breakfast.   Yes Historical Provider, MD   BP 115/75 mmHg  Pulse 121  Temp(Src) 97.3 F (36.3 C) (Rectal)  Resp 28  Ht 5\' 9"  (1.753 m)  Wt 195 lb (88.451 kg)  BMI 28.78 kg/m2  SpO2 100% Physical Exam  Nursing note and vitals reviewed.  64 year old male, poorly responsive than with Kussmaul respirations. Vital signs are significant for tachycardia and tachypnea. Oxygen saturation is 100%, which is normal. Head is normocephalic and atraumatic. PERRLA, EOMI. Oropharynx is clear. Mucous membranes are dry. Neck is nontender and supple without adenopathy or JVD. Back is nontender and there is no CVA tenderness. Lungs have faint rhonchi without rales or wheezes. Chest is nontender. Heart has regular rate and rhythm without murmur. Abdomen is soft, flat, nontender without masses or hepatosplenomegaly and peristalsis is normoactive. Extremities are somewhat mottled with no edema. Skin is warm and dry without rash. Neurologic: He is obtunded but will answer some questions and will follow some commands, cranial nerves are intact, there are no motor or sensory deficits.  ED Course  Procedures (including critical care time) Labs Review Results for orders placed or performed during the hospital encounter of 10/22/14  Comprehensive metabolic panel  Result Value Ref Range   Sodium 128 (L) 135 - 145 mmol/L   Potassium 5.8 (H) 3.5 - 5.1 mmol/L   Chloride 87 (L) 96 - 112 mmol/L  CO2 6 (LL) 19 - 32 mmol/L   Glucose, Bld 1092 (HH) 70 - 99 mg/dL   BUN 55 (H) 6 - 23 mg/dL   Creatinine, Ser 2.86 (H) 0.50 - 1.35 mg/dL   Calcium 8.8 8.4 - 10.5 mg/dL   Total Protein 8.4 (H) 6.0 - 8.3 g/dL   Albumin 3.7 3.5 - 5.2 g/dL   AST 16 0 - 37 U/L   ALT 13 0 - 53 U/L   Alkaline Phosphatase 176 (H) 39 - 117 U/L   Total Bilirubin 2.1 (H) 0.3 - 1.2 mg/dL   GFR calc non Af Amer 22 (L) >90 mL/min    GFR calc Af Amer 26 (L) >90 mL/min   Anion gap 35 (H) 5 - 15  Troponin I  Result Value Ref Range   Troponin I <0.03 <0.031 ng/mL  Lipase, blood  Result Value Ref Range   Lipase 37 11 - 59 U/L  CBC with Differential  Result Value Ref Range   WBC 25.4 (H) 4.0 - 10.5 K/uL   RBC 4.42 4.22 - 5.81 MIL/uL   Hemoglobin 14.5 13.0 - 17.0 g/dL   HCT 46.5 39.0 - 52.0 %   MCV 105.2 (H) 78.0 - 100.0 fL   MCH 32.8 26.0 - 34.0 pg   MCHC 31.2 30.0 - 36.0 g/dL   RDW 13.1 11.5 - 15.5 %   Platelets 581 (H) 150 - 400 K/uL   Neutrophils Relative % 83 (H) 43 - 77 %   Lymphocytes Relative 8 (L) 12 - 46 %   Monocytes Relative 9 3 - 12 %   Eosinophils Relative 0 0 - 5 %   Basophils Relative 0 0 - 1 %   Band Neutrophils 0 0 - 10 %   Metamyelocytes Relative 0 %   Myelocytes 0 %   Promyelocytes Absolute 0 %   Blasts 0 %   nRBC 0 0 /100 WBC   Neutro Abs 21.1 (H) 1.7 - 7.7 K/uL   Lymphs Abs 2.0 0.7 - 4.0 K/uL   Monocytes Absolute 2.3 (H) 0.1 - 1.0 K/uL   Eosinophils Absolute 0.0 0.0 - 0.7 K/uL   Basophils Absolute 0.0 0.0 - 0.1 K/uL   WBC Morphology ATYPICAL LYMPHOCYTES   Lactic acid, plasma  Result Value Ref Range   Lactic Acid, Venous 6.0 (HH) 0.5 - 2.0 mmol/L  Blood gas, arterial  Result Value Ref Range   O2 Content 2.0 L/min   Delivery systems NASAL CANNULA    pH, Arterial 6.991 (LL) 7.350 - 7.450   pCO2 arterial 12.8 (LL) 35.0 - 45.0 mmHg   pO2, Arterial 135.0 (H) 80.0 - 100.0 mmHg   Bicarbonate 2.9 (L) 20.0 - 24.0 mEq/L   TCO2 2.9 0 - 100 mmol/L   Acid-base deficit 26.5 (H) 0.0 - 2.0 mmol/L   O2 Saturation 96.5 %   Patient temperature 37.0    Collection site RIGHT RADIAL    Drawn by [id number]    Sample type ARTERIAL    Allens test (pass/fail) PASS PASS  Magnesium  Result Value Ref Range   Magnesium 3.4 (H) 1.5 - 2.5 mg/dL  Phosphorus  Result Value Ref Range   Phosphorus 11.2 (H) 2.3 - 4.6 mg/dL  Urinalysis, Routine w reflex microscopic  Result Value Ref Range   Color, Urine YELLOW  YELLOW   APPearance CLEAR CLEAR   Specific Gravity, Urine 1.025 1.005 - 1.030   pH 5.5 5.0 - 8.0   Glucose, UA >1000 (A) NEGATIVE mg/dL  Hgb urine dipstick MODERATE (A) NEGATIVE   Bilirubin Urine SMALL (A) NEGATIVE   Ketones, ur 40 (A) NEGATIVE mg/dL   Protein, ur TRACE (A) NEGATIVE mg/dL   Urobilinogen, UA 0.2 0.0 - 1.0 mg/dL   Nitrite NEGATIVE NEGATIVE   Leukocytes, UA NEGATIVE NEGATIVE  Urine microscopic-add on  Result Value Ref Range   Squamous Epithelial / LPF RARE RARE   WBC, UA 0-2 <3 WBC/hpf   RBC / HPF 0-2 <3 RBC/hpf   Bacteria, UA RARE RARE  CBG monitoring, ED  Result Value Ref Range   Glucose-Capillary >600 (HH) 70 - 99 mg/dL  CBG monitoring, ED  Result Value Ref Range   Glucose-Capillary >600 (HH) 70 - 99 mg/dL   Imaging Review Dg Chest Port 1 View  10/22/2014   CLINICAL DATA:  Weakness, shortness of breath, fever, and altered mental status.  EXAM: PORTABLE CHEST - 1 VIEW  COMPARISON:  None.  FINDINGS: Borderline heart size with normal pulmonary vascularity. No focal airspace disease or consolidation in the lungs. No blunting of costophrenic angles. No pneumothorax. Mediastinal contours appear intact. Calcified aorta.  IMPRESSION: Borderline heart size.  No evidence of active pulmonary disease.   Electronically Signed   By: Lucienne Capers M.D.   On: 10/22/2014 04:54     EKG Interpretation   Date/Time:  Sunday October 22 2014 04:01:24 EST Ventricular Rate:  122 PR Interval:  106 QRS Duration: 151 QT Interval:  349 QTC Calculation: 497 R Axis:   -72 Text Interpretation:  Sinus tachycardia RBBB and LAFB Left ventricular  hypertrophy Anterolateral infarct, age indeterminate Baseline wander in  lead(s) V1 No old tracing to compare Confirmed by [facility]  MD, Solyana Nonaka ([id number])  on 10/22/2014 4:26:45 AM     CRITICAL CARE Performed by: ZHYQM,VHQIO Total critical care time: 150 minutes Critical care time was exclusive of separately billable procedures and treating other  patients. Critical care was necessary to treat or prevent imminent or life-threatening deterioration. Critical care was time spent personally by me on the following activities: development of treatment plan with patient and/or surrogate as well as nursing, discussions with consultants, evaluation of patient's response to treatment, examination of patient, obtaining history from patient or surrogate, ordering and performing treatments and interventions, ordering and review of laboratory studies, ordering and review of radiographic studies, pulse oximetry and re-evaluation of patient's condition.  MDM   Final diagnoses:  DKA (diabetic ketoacidoses)  Acute kidney injury  Lactic acid acidosis  Hyperkalemia  Hypermagnesemia  Hyperphosphatemia  Thrombocytosis  Elevated bilirubin  Elevated alkaline phosphatase level    Diabetic ketoacidosis. Trigger appears to have been some kind of viral infection. Blood sugar is greater than 600 by CBG here. He is started on aggressive IV hydration and started on an insulin drip. With possible history of hematemesis, he is started on pantoprazole and blood is sent for type and screen. He has no old records at our institution.  Laboratory workup confirms ketoacidosis with severe acidemia and I will with pH of 6.99. Anion gap is markedly elevated at 33 but a portion of this is lactic acidosis with lactic acid of 7. Elevated BUN and creatinine are noted. Unable to access his records at [facility] and his creatinine was 0.9 on 03/07/2014. BUN was 10 at that time. Office records indicate his diabetes had been poorly controlled with hemoglobin A1c of 9 at office visit in October. Additional laboratory information shows blood sugar over thousand and calculated serum osmolality of 336. No obvious precipitating cause  is seen. No evidence of pneumonia or urinary tract infection. Hemoglobin is normal and I could not find a hemoglobin on the records from [facility].  Because of possibility of upper GI bleed, and he is started on pantoprazole drip. Case is discussed with Dr. Maudie Mercury of [facility] who agrees to admit the patient to ICU.  Delora Fuel, MD 17/49/44 [id number]

## 2014-10-22 NOTE — ED Notes (Signed)
CRITICAL VALUE ALERT  Critical value received:  Blood gas  Date of notification:  10/22/14  Time of notification:  7622  Critical value read back: yes  Nurse who received alert: Cheri Rous, RN  MD notified (1st page):  Roxanne Mins  Time of first page: [phone]  MD notified (2nd page):  Time of second page:  Responding MD:  Roxanne Mins  Time MD responded:  [phone]

## 2014-10-22 NOTE — ED Notes (Signed)
RT paged for blood gas

## 2014-10-22 NOTE — ED Notes (Signed)
AC paged for insulin drip

## 2014-10-23 ENCOUNTER — Inpatient Hospital Stay (HOSPITAL_COMMUNITY): Payer: BLUE CROSS/BLUE SHIELD

## 2014-10-23 DIAGNOSIS — R000 Tachycardia, unspecified: Secondary | ICD-10-CM

## 2014-10-23 DIAGNOSIS — J189 Pneumonia, unspecified organism: Secondary | ICD-10-CM

## 2014-10-23 LAB — BASIC METABOLIC PANEL
ANION GAP: 4 — AB (ref 5–15)
ANION GAP: 8 (ref 5–15)
Anion gap: 6 (ref 5–15)
Anion gap: 5 (ref 5–15)
Anion gap: 8 (ref 5–15)
BUN: 31 mg/dL — ABNORMAL HIGH (ref 6–23)
BUN: 27 mg/dL — ABNORMAL HIGH (ref 6–23)
BUN: 25 mg/dL — ABNORMAL HIGH (ref 6–23)
BUN: 21 mg/dL (ref 6–23)
BUN: 18 mg/dL (ref 6–23)
CHLORIDE: 116 mmol/L — AB (ref 96–112)
CHLORIDE: 111 mmol/L (ref 96–112)
CO2: 22 mmol/L (ref 19–32)
CO2: 22 mmol/L (ref 19–32)
CO2: 23 mmol/L (ref 19–32)
CO2: 24 mmol/L (ref 19–32)
CO2: 25 mmol/L (ref 19–32)
Calcium: 8.4 mg/dL (ref 8.4–10.5)
Calcium: 8.1 mg/dL — ABNORMAL LOW (ref 8.4–10.5)
Calcium: 8.7 mg/dL (ref 8.4–10.5)
Calcium: 8.4 mg/dL (ref 8.4–10.5)
Calcium: 8.4 mg/dL (ref 8.4–10.5)
Chloride: 112 mmol/L (ref 96–112)
Chloride: 109 mmol/L (ref 96–112)
Chloride: 106 mmol/L (ref 96–112)
Creatinine, Ser: 0.85 mg/dL (ref 0.50–1.35)
Creatinine, Ser: 0.89 mg/dL (ref 0.50–1.35)
Creatinine, Ser: 0.81 mg/dL (ref 0.50–1.35)
Creatinine, Ser: 0.78 mg/dL (ref 0.50–1.35)
Creatinine, Ser: 0.75 mg/dL (ref 0.50–1.35)
GFR calc Af Amer: >90 mL/min (ref >90)
GFR calc Af Amer: >90 mL/min (ref >90)
GFR calc Af Amer: >90 mL/min (ref >90)
GFR calc non Af Amer: >90 mL/min (ref >90)
GFR calc non Af Amer: 90 mL/min — ABNORMAL LOW (ref >90)
GFR calc non Af Amer: >90 mL/min (ref >90)
GFR calc non Af Amer: >90 mL/min (ref >90)
GLUCOSE: 178 mg/dL — AB (ref 70–99)
GLUCOSE: 210 mg/dL — AB (ref 70–99)
Glucose, Bld: 200 mg/dL — ABNORMAL HIGH (ref 70–99)
Glucose, Bld: 280 mg/dL — ABNORMAL HIGH (ref 70–99)
Glucose, Bld: 245 mg/dL — ABNORMAL HIGH (ref 70–99)
POTASSIUM: 3.8 mmol/L (ref 3.5–5.1)
POTASSIUM: 4 mmol/L (ref 3.5–5.1)
POTASSIUM: 4 mmol/L (ref 3.5–5.1)
POTASSIUM: 3.4 mmol/L — AB (ref 3.5–5.1)
Potassium: 3.6 mmol/L (ref 3.5–5.1)
SODIUM: 139 mmol/L (ref 135–145)
Sodium: 144 mmol/L (ref 135–145)
Sodium: 138 mmol/L (ref 135–145)
Sodium: 142 mmol/L (ref 135–145)
Sodium: 138 mmol/L (ref 135–145)

## 2014-10-23 LAB — CBC
HCT: 32.9 % — ABNORMAL LOW (ref 39.0–52.0)
HEMATOCRIT: 34.7 % — AB (ref 39.0–52.0)
HEMOGLOBIN: 12 g/dL — AB (ref 13.0–17.0)
Hemoglobin: 11.5 g/dL — ABNORMAL LOW (ref 13.0–17.0)
MCH: 31 pg (ref 26.0–34.0)
MCH: 30.7 pg (ref 26.0–34.0)
MCHC: 35 g/dL (ref 30.0–36.0)
MCHC: 34.6 g/dL (ref 30.0–36.0)
MCV: 88.7 fL (ref 78.0–100.0)
MCV: 88.7 fL (ref 78.0–100.0)
Platelets: 328 10*3/uL (ref 150–400)
Platelets: 329 10*3/uL (ref 150–400)
RBC: 3.71 MIL/uL — ABNORMAL LOW (ref 4.22–5.81)
RBC: 3.91 MIL/uL — ABNORMAL LOW (ref 4.22–5.81)
RDW: 12.7 % (ref 11.5–15.5)
RDW: 13.3 % (ref 11.5–15.5)
WBC: 20.6 10*3/uL — ABNORMAL HIGH (ref 4.0–10.5)
WBC: 15.9 10*3/uL — AB (ref 4.0–10.5)

## 2014-10-23 LAB — GLUCOSE, CAPILLARY
GLUCOSE-CAPILLARY: 105 mg/dL — AB (ref 70–99)
GLUCOSE-CAPILLARY: 153 mg/dL — AB (ref 70–99)
GLUCOSE-CAPILLARY: 171 mg/dL — AB (ref 70–99)
GLUCOSE-CAPILLARY: 177 mg/dL — AB (ref 70–99)
GLUCOSE-CAPILLARY: 74 mg/dL (ref 70–99)
Glucose-Capillary: 124 mg/dL — ABNORMAL HIGH (ref 70–99)
Glucose-Capillary: 164 mg/dL — ABNORMAL HIGH (ref 70–99)
Glucose-Capillary: 249 mg/dL — ABNORMAL HIGH (ref 70–99)
Glucose-Capillary: 234 mg/dL — ABNORMAL HIGH (ref 70–99)

## 2014-10-23 LAB — URINE CULTURE
COLONY COUNT: NO GROWTH
Culture: NO GROWTH

## 2014-10-23 LAB — MAGNESIUM: Magnesium: 2.2 mg/dL (ref 1.5–2.5)

## 2014-10-23 MED ORDER — METOPROLOL TARTRATE 25 MG PO TABS
25.0000 mg | ORAL_TABLET | Freq: Two times a day (BID) | ORAL | Status: DC
  Administered 2014-10-23 – 2014-10-24 (×3): 25 mg via ORAL
  Filled 2014-10-23 (×3): qty 1

## 2014-10-23 MED ORDER — INSULIN ASPART 100 UNIT/ML ~~LOC~~ SOLN
0.0000 [IU] | Freq: Three times a day (TID) | SUBCUTANEOUS | Status: DC
  Administered 2014-10-23 (×2): 3 [IU] via SUBCUTANEOUS
  Administered 2014-10-23: 5 [IU] via SUBCUTANEOUS
  Administered 2014-10-24: 8 [IU] via SUBCUTANEOUS
  Administered 2014-10-24: 15 [IU] via SUBCUTANEOUS

## 2014-10-23 MED ORDER — ENOXAPARIN SODIUM 40 MG/0.4ML ~~LOC~~ SOLN
40.0000 mg | SUBCUTANEOUS | Status: DC
  Administered 2014-10-24: 40 mg via SUBCUTANEOUS
  Filled 2014-10-23: qty 0.4

## 2014-10-23 MED ORDER — PANTOPRAZOLE SODIUM 40 MG PO TBEC
40.0000 mg | DELAYED_RELEASE_TABLET | Freq: Two times a day (BID) | ORAL | Status: DC
  Administered 2014-10-23 – 2014-10-24 (×3): 40 mg via ORAL
  Filled 2014-10-23 (×3): qty 1

## 2014-10-23 MED ORDER — INSULIN ASPART 100 UNIT/ML ~~LOC~~ SOLN
0.0000 [IU] | Freq: Every day | SUBCUTANEOUS | Status: DC
  Administered 2014-10-23: 3 [IU] via SUBCUTANEOUS

## 2014-10-23 MED ORDER — DEXTROSE-NACL 5-0.45 % IV SOLN
INTRAVENOUS | Status: DC
  Administered 2014-10-23: 01:00:00 via INTRAVENOUS

## 2014-10-23 MED ORDER — SODIUM CHLORIDE 0.9 % IV SOLN
INTRAVENOUS | Status: DC
  Administered 2014-10-23: 06:00:00 via INTRAVENOUS

## 2014-10-23 MED ORDER — INSULIN ASPART 100 UNIT/ML ~~LOC~~ SOLN: 0.0000 [IU] | Freq: Three times a day (TID) | SUBCUTANEOUS | Status: DC

## 2014-10-23 MED ORDER — ASPIRIN EC 81 MG PO TBEC
162.0000 mg | DELAYED_RELEASE_TABLET | Freq: Every day | ORAL | Status: DC
  Administered 2014-10-23 – 2014-10-24 (×2): 162 mg via ORAL
  Filled 2014-10-23 (×2): qty 2

## 2014-10-23 MED ORDER — INSULIN ASPART 100 UNIT/ML ~~LOC~~ SOLN
4.0000 [IU] | Freq: Three times a day (TID) | SUBCUTANEOUS | Status: DC
  Administered 2014-10-24 (×2): 4 [IU] via SUBCUTANEOUS

## 2014-10-23 NOTE — Care Management Note (Signed)
UR completed 

## 2014-10-23 NOTE — Progress Notes (Signed)
Nutrition Brief Note  Patient identified on the Malnutrition Screening Tool (MST) Report. Presented with DKA. Anion gap 4. He's being transferred out of ICU.  Wt Readings from Last 15 Encounters:  10/23/14 200 lb 2.8 oz (90.8 kg)    Body mass index is 29.55 kg/(m^2). Patient meets criteria for overweight based on current BMI. Pt reports usual weight as 193#.   Current diet order is CHO Modified, patient is consuming approximately n/a% of meals at this time. His appetite was good prior to acute illness. He is very thirsty during my visit requesting water which was provided.  He lives with spouse who shops and prepares the food for the household. He says he tries to be aware of his carb intake everyday.   Pt declines review of basic cho counting. He was encouraged to let nursing know if he has diet related questions during his admission. RD will return.  He checks his blood glucose TID and says his last HgA1C was 6.5%. Labs and medications reviewed.    Colman Cater MS,RD,CSG,LDN Office: [phone] Pager: [phone]

## 2014-10-23 NOTE — Progress Notes (Signed)
[facility] PROGRESS NOTE  Filed Weights   10/22/14 0346 10/22/14 0713 10/23/14 0500  Weight: 88.451 kg (195 lb) 85.7 kg (188 lb 15 oz) 90.8 kg (200 lb 2.8 oz)        Intake/Output Summary (Last 24 hours) at 10/23/14 0824 Last data filed at 10/23/14 0600  Gross per 24 hour  Intake 4329.25 ml  Output   2750 ml  Net 1579.25 ml     Assessment/Plan: DKA (diabetic ketoacidoses) - Anion gap close, bicarbonate of 20. He was transitioned to Lantus plus sliding scale related. - We'll stop the D5. - Continue CBGs before meals and at bedtime. - PT consult transferred to regular floor.  Community-acquired pneumonia: - Being covered with Rocephin and azithromycin. - He has remained afebrile his shortness of breath has improved, his leukocytosis is also improving. - Continue IV antibiotic transferred in to telemetry bed. - Influenza PCR negative.  Sinus tachycardia: - No improvement with hydration and diltiazem, we'll transition him to metoprolol and stop the diltiazem drip. - Check a 2-D echo.  Acute kidney injury: - Resolved with hydration also be due to prerenal etiology.  Hyperkalemia - Resolve with treatment of DKA.   Hyponatremia: - Pseudohyponatremia due to hyperglycemia. - Resolved.  Nausea and vomiting question hematemesis -  He was started on Protonix to once a day, his hemoglobin dropped from 13-11 was her was probably a component of hemodilution. - FOBT is pending has not had a bowel movement. -Continue to monitor   Status: full Family Communication: none  Disposition Plan: inpatient   Consultants:  none  Procedures: ECHO: none  Antibiotics:  tamiflu  HPI/Subjective: H Lucy feels much better.   Objective: Filed Vitals:   10/23/14 0300 10/23/14 0400 10/23/14 0500 10/23/14 0800  BP: 130/62  115/56   Pulse: 95 95 96   Temp:  98.1 F (36.7 C)  97.5 F (36.4 C)  TempSrc:  Oral  Oral  Resp: 24 24 25    Height:      Weight:   90.8 kg  (200 lb 2.8 oz)   SpO2: 94% 94% 93%      Exam:  General: Alert, awake, oriented x3, in no acute distress.  HEENT: No bruits, no goiter.  Heart: Regular rate and rhythm.  Lungs: Good air movement, clear Abdomen: Soft, nontender, nondistended, positive bowel sounds.  Neuro: Grossly intact, nonfocal.   Data Reviewed: Basic Metabolic Panel:  Recent Labs Lab 10/22/14 0410 10/22/14 0727 10/22/14 1035 10/22/14 1443 10/22/14 1921 10/22/14 2323 10/23/14 0501  NA 128* 137 139 143 142 142 144  K 5.8* 4.7 3.9 3.5 3.5 3.6 3.8  CL 87* 106 111 119* 119* 121* 116*  CO2 6* 6* 11* 18* 21 18* 22  GLUCOSE 1092* 765* 537* 301* 157* 106* 178*  BUN 55* 51* 50* 46* 40* 31* 31*  CREATININE 2.86* 2.31* 1.97* 1.34 1.05 0.83 0.85  CALCIUM 8.8 7.9* 8.2* 8.2* 8.0* 6.8* 8.4  MG 3.4* 2.7*  --   --   --   --  2.2  PHOS 11.2*  --   --   --   --   --   --    Liver Function Tests:  Recent Labs Lab 10/22/14 0410  AST 16  ALT 13  ALKPHOS 176*  BILITOT 2.1*  PROT 8.4*  ALBUMIN 3.7    Recent Labs Lab 10/22/14 0410  LIPASE 37   No results for input(s): AMMONIA in the last 168 hours. CBC:  Recent Labs Lab  10/22/14 0410 10/22/14 1211 10/22/14 2323 10/23/14 0501  WBC 25.4* 24.5* 18.9* 20.6*  NEUTROABS 21.1*  --   --   --   HGB 14.5 13.3 9.9* 11.5*  HCT 46.5 37.2* 28.5* 32.9*  MCV 105.2* 94.2 89.1 88.7  PLT 581* 394 305 328   Cardiac Enzymes:  Recent Labs Lab 10/22/14 0410 10/22/14 1443  TROPONINI <0.03 0.12*   BNP (last 3 results) No results for input(s): PROBNP in the last 8760 hours. CBG:  Recent Labs Lab 10/23/14 0108 10/23/14 0157 10/23/14 0400 10/23/14 0646 10/23/14 0731  GLUCAP 105* 124* 153* 177* 164*    Recent Results (from the past 240 hour(s))  Culture, blood (routine x 2)     Status: None (Preliminary result)   Collection Time: 10/22/14  4:53 AM  Result Value Ref Range Status   Specimen Description BLOOD LEFT ARM  Final   Special Requests BOTTLES  DRAWN AEROBIC AND ANAEROBIC 6CC BOTTLES  Final   Culture PENDING  Incomplete   Report Status PENDING  Incomplete  Culture, blood (routine x 2)     Status: None (Preliminary result)   Collection Time: 10/22/14  5:11 AM  Result Value Ref Range Status   Specimen Description BLOOD LEFT ARM  Final   Special Requests BOTTLES DRAWN AEROBIC ONLY 6CC BOTTLE  Final   Culture PENDING  Incomplete   Report Status PENDING  Incomplete  MRSA PCR Screening     Status: None   Collection Time: 10/22/14  6:55 AM  Result Value Ref Range Status   MRSA by PCR NEGATIVE NEGATIVE Final    Comment:        The GeneXpert MRSA Assay (FDA approved for NASAL specimens only), is one component of a comprehensive MRSA colonization surveillance program. It is not intended to diagnose MRSA infection nor to guide or monitor treatment for MRSA infections.      Studies: Dg Chest 1 View  10/23/2014   CLINICAL DATA:  Shortness of breath, pneumonia, altered mental status  EXAM: CHEST - 1 VIEW  COMPARISON:  Portable chest x-ray of October 22, 2014  FINDINGS: The lungs are mildly hyperinflated. There remains confluent increased airspace opacity in the inferior aspect of the right upper lobe. Minimally increased parenchymal density in the left upper lobe is noted but less conspicuous. There is no pleural effusion. The cardiac silhouette remains mildly enlarged. The pulmonary vascularity is less prominent today. The trachea is midline. The right-sided PICC line tip projects over the midportion of the SVC.  IMPRESSION: There has not been significant interval change in the appearance of the right upper lobe pneumonia since the previous study. Parenchymal density in the left upper lobe is less conspicuous today. Improving underlying pulmonary interstitial edema.   Electronically Signed   By: David  Martinique   On: 10/23/2014 07:53   Dg Chest Port 1 View  10/22/2014   CLINICAL DATA:  Right PICC placement.  Initial encounter.  EXAM:  PORTABLE CHEST - 1 VIEW  COMPARISON:  Chest radiograph performed earlier today at 4:19 p.m.  FINDINGS: The patient's right PICC is noted ending about the mid to distal SVC.  Patchy bilateral airspace opacities are again seen, more prominent on the right, concerning for multifocal pneumonia. Underlying vascular congestion is noted. No pleural effusion or pneumothorax is identified.  The cardiomediastinal silhouette is borderline normal in size. There is slight unusual prominence of the right paratracheal soft tissues. This may simply reflect normal vasculature. No acute osseous abnormalities are seen.  IMPRESSION:  1. Right PICC noted ending about the mid to distal SVC. 2. Patchy bilateral airspace opacities, more prominent on the right, concerning for multifocal pneumonia. Underlying vascular congestion seen.   Electronically Signed   By: Garald Balding M.D.   On: 10/22/2014 18:43   Dg Chest Port 1 View  10/22/2014   CLINICAL DATA:  Weakness, shortness of breath, fever, and altered mental status.  EXAM: PORTABLE CHEST - 1 VIEW  COMPARISON:  None.  FINDINGS: Borderline heart size with normal pulmonary vascularity. No focal airspace disease or consolidation in the lungs. No blunting of costophrenic angles. No pneumothorax. Mediastinal contours appear intact. Calcified aorta.  IMPRESSION: Borderline heart size.  No evidence of active pulmonary disease.   Electronically Signed   By: Lucienne Capers M.D.   On: 10/22/2014 04:54   Dg Chest Port 1v Same Day  10/22/2014   CLINICAL DATA:  Fever and difficulty breathing  EXAM: PORTABLE CHEST - 1 VIEW SAME DAY  COMPARISON:  Study obtained earlier in the day  FINDINGS: There is now patchy airspace opacity in the right upper lobe. A lesser degree of airspace opacity is noted in the left upper lobe. There is underlying emphysema. Heart is upper normal in size with pulmonary vascularity within normal limits. No adenopathy. No bone lesions.  IMPRESSION: Patchy airspace opacity  in both upper lobes, more on the right than on the left, new from earlier in the day. Underlying emphysema. No change in cardiac silhouette.   Electronically Signed   By: Lowella Grip M.D.   On: 10/22/2014 16:56    Scheduled Meds: . antiseptic oral rinse  7 mL Mouth Rinse q12n4p  . azithromycin  500 mg Intravenous Q24H  . cefTRIAXone (ROCEPHIN)  IV  1 g Intravenous Q24H  . chlorhexidine  15 mL Mouth Rinse BID  . enoxaparin (LOVENOX) injection  30 mg Subcutaneous Q24H  . insulin aspart  0-15 Units Subcutaneous 6 times per day  . insulin glargine  15 Units Subcutaneous QHS  . levofloxacin (LEVAQUIN) IV  750 mg Intravenous Q48H  . levothyroxine  50 mcg Oral QAC breakfast  . oseltamivir  30 mg Oral BID  . [START ON 10/25/2014] pantoprazole (PROTONIX) IV  40 mg Intravenous Q12H  . sodium chloride  10-40 mL Intracatheter Q12H   Continuous Infusions: . dextrose 100 mL/hr at 10/22/14 1706  . diltiazem (CARDIZEM) infusion 5 mg/hr (10/23/14 0600)  . pantoprozole (PROTONIX) infusion 8 mg/hr (10/23/14 0600)     Charlynne Cousins  [facility] Pager [phone]  If 8PM-8AM, please contact night-coverage at [email or web address], password [facility] 10/23/2014, 8:24 AM  LOS: 1 day

## 2014-10-23 NOTE — Progress Notes (Signed)
  Echocardiogram 2D Echocardiogram has been performed.  [place], Lucky 10/23/2014, 4:07 PM

## 2014-10-23 NOTE — Evaluation (Signed)
Physical Therapy Evaluation Patient Details Name: Corey Warren MRN: [id number] DOB: 09-May-1951 Today's Date: 10/23/2014   History of Present Illness  Pt is a 64 year old male who was admitted with ketoacidosis.  He is married and lives in [place], [place].  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  He states that he does not want to eat and that he has been "lying in bed for 4 days".  Clinical Impression  Pt was seen for evaluation.  He appeared to be somewhat lethargic and stated that he did not feel that he would be able to ambulate.  His strength on MMT was WNL.  He was able to transfer OOB without assist.  He has a slumped posture with forward head.  He requested to use a walker for gait and was able to ambulate 200' with good stability.  I am hoping that he will be able to transition off of the walker in the near future.  He may need HHPT at d/c.    Follow Up Recommendations Home health PT    Equipment Recommendations  None recommended by PT    Recommendations for Other Services   none    Precautions / Restrictions Precautions Precautions: Fall Restrictions Weight Bearing Restrictions: No      Mobility  Bed Mobility Overal bed mobility: Independent                Transfers Overall transfer level: Needs assistance Equipment used: None Transfers: Sit to/from Stand Sit to Stand: Supervision         General transfer comment: Pt was able to stand independently but wanted to hold onto walker for increased stability  Ambulation/Gait Ambulation/Gait assistance: Supervision Ambulation Distance (Feet): 200 Feet Assistive device: Rolling walker (2 wheeled) Gait Pattern/deviations: Trunk flexed   Gait velocity interpretation: at or above normal speed for age/gender    Stairs            Wheelchair Mobility    Modified Rankin (Stroke Patients Only)       Balance Overall balance assessment: Modified Independent                                            Pertinent Vitals/Pain Pain Assessment: No/denies pain    Home Living Family/patient expects to be discharged to:: Private residence Living Arrangements: Spouse/significant other Available Help at Discharge: Family;Available 24 hours/day Type of Home: House Home Access: Stairs to enter   [employer] of Steps: 2 Home Layout: One level Home Equipment: Walker - 2 wheels      Prior Function Level of Independence: Independent               Hand Dominance   Dominant Hand: Right    Extremity/Trunk Assessment               Lower Extremity Assessment: Generalized weakness (deconditioned)      Cervical / Trunk Assessment: Kyphotic  Communication   Communication: No difficulties  Cognition Arousal/Alertness: Lethargic Behavior During Therapy: WFL for tasks assessed/performed Overall Cognitive Status: Within Functional Limits for tasks assessed                      General Comments      Exercises        Assessment/Plan    PT Assessment Patient needs continued  PT services  PT Diagnosis Difficulty walking;Generalized weakness   PT Problem List Decreased activity tolerance;Decreased mobility  PT Treatment Interventions Gait training;Functional mobility training;Therapeutic exercise   PT Goals (Current goals can be found in the Care Plan section) Acute Rehab PT Goals Patient Stated Goal: none PT Goal Formulation: With patient Time For Goal Achievement: 11/06/14 Potential to Achieve Goals: Good    Frequency Min 3X/week   Barriers to discharge   none    Co-evaluation               End of Session Equipment Utilized During Treatment: Gait belt Activity Tolerance: Patient tolerated treatment well Patient left: in chair;with call bell/phone within reach Nurse Communication: Mobility status         Time: 1200-1232 PT Time Calculation (min) (ACUTE ONLY): 32 min   Charges:   PT  Evaluation $Initial PT Evaluation Tier I: 1 Procedure     PT G CodesDemetrios Isaacs L 10/23/2014, 12:50 PM

## 2014-10-24 DIAGNOSIS — E875 Hyperkalemia: Secondary | ICD-10-CM

## 2014-10-24 DIAGNOSIS — J189 Pneumonia, unspecified organism: Secondary | ICD-10-CM

## 2014-10-24 DIAGNOSIS — E871 Hypo-osmolality and hyponatremia: Secondary | ICD-10-CM

## 2014-10-24 LAB — CBC
HCT: 33.5 % — ABNORMAL LOW (ref 39.0–52.0)
HEMOGLOBIN: 11.5 g/dL — AB (ref 13.0–17.0)
MCH: 31.5 pg (ref 26.0–34.0)
MCHC: 34.3 g/dL (ref 30.0–36.0)
MCV: 91.8 fL (ref 78.0–100.0)
PLATELETS: 324 10*3/uL (ref 150–400)
RBC: 3.65 MIL/uL — ABNORMAL LOW (ref 4.22–5.81)
RDW: 13.3 % (ref 11.5–15.5)
WBC: 10.8 10*3/uL — ABNORMAL HIGH (ref 4.0–10.5)

## 2014-10-24 LAB — BASIC METABOLIC PANEL
Anion gap: 5 (ref 5–15)
Anion gap: 8 (ref 5–15)
BUN: 15 mg/dL (ref 6–23)
BUN: 16 mg/dL (ref 6–23)
CHLORIDE: 104 mmol/L (ref 96–112)
CO2: 27 mmol/L (ref 19–32)
CO2: 26 mmol/L (ref 19–32)
Calcium: 8.1 mg/dL — ABNORMAL LOW (ref 8.4–10.5)
Calcium: 8.3 mg/dL — ABNORMAL LOW (ref 8.4–10.5)
Chloride: 106 mmol/L (ref 96–112)
Creatinine, Ser: 0.72 mg/dL (ref 0.50–1.35)
Creatinine, Ser: 0.68 mg/dL (ref 0.50–1.35)
GFR calc Af Amer: >90 mL/min (ref >90)
GFR calc Af Amer: >90 mL/min (ref >90)
GFR calc non Af Amer: >90 mL/min (ref >90)
GLUCOSE: 195 mg/dL — AB (ref 70–99)
Glucose, Bld: 249 mg/dL — ABNORMAL HIGH (ref 70–99)
Potassium: 3.4 mmol/L — ABNORMAL LOW (ref 3.5–5.1)
Potassium: 3.5 mmol/L (ref 3.5–5.1)
SODIUM: 138 mmol/L (ref 135–145)
Sodium: 138 mmol/L (ref 135–145)

## 2014-10-24 LAB — GLUCOSE, CAPILLARY
GLUCOSE-CAPILLARY: 251 mg/dL — AB (ref 70–99)
GLUCOSE-CAPILLARY: 351 mg/dL — AB (ref 70–99)

## 2014-10-24 MED ORDER — METOPROLOL TARTRATE 25 MG PO TABS: 25.0000 mg | ORAL_TABLET | Freq: Two times a day (BID) | ORAL | Status: DC

## 2014-10-24 MED ORDER — GUAIFENESIN ER 600 MG PO TB12: 600.0000 mg | ORAL_TABLET | Freq: Two times a day (BID) | ORAL | Status: DC

## 2014-10-24 MED ORDER — LEVOFLOXACIN 750 MG PO TABS: 750.0000 mg | ORAL_TABLET | Freq: Every day | ORAL | Status: DC

## 2014-10-24 MED ORDER — INSULIN GLARGINE 100 UNIT/ML ~~LOC~~ SOLN
18.0000 [IU] | Freq: Every day | SUBCUTANEOUS | Status: DC
  Filled 2014-10-24: qty 0.18

## 2014-10-24 NOTE — Progress Notes (Signed)
Wife came to take patient home, went over discharge instruction and taken down to car via wheelchair.

## 2014-10-24 NOTE — Care Management Note (Addendum)
    Page 1 of 1   10/24/2014     1:33:57 PM CARE MANAGEMENT NOTE 10/24/2014  Patient:  Corey Warren, Corey Warren   Account Number:  [id number]  Date Initiated:  10/24/2014  Documentation initiated by:  Jolene Provost  Subjective/Objective Assessment:   Pt is from home, lives with wife and has worked for the [employer] for 22 years. Pt is independent at baseline and has no HH services, DME's or med needs prior to admission. Pt has no PCP but goes to [facility] for Endocrinology services.     Action/Plan:   The pt states he would like to talk to the financial counselor about getting long term disability because "he just cant keep doing this". Pt plans to discharge home with self care. Consult made for the fin. counselor. List of PCP's given.   Anticipated DC Date:  10/27/2014   Anticipated DC Plan:  HOME/SELF CARE  In-house referral  Financial Counselor      DC Planning Services  CM consult      Choice offered to / List presented to:             Status of service:  Completed, signed off Medicare Important Message given?   (If response is "NO", the following Medicare IM given date fields will be blank) Date Medicare IM given:   Medicare IM given by:   Date Additional Medicare IM given:   Additional Medicare IM given by:    Discharge Disposition:  HOME/SELF CARE  Per UR Regulation:    If discussed at Long Length of Stay Meetings, dates discussed:    Comments:  10/24/2014 [place], RN, MSN, CM Pt given list of area PCP's and endocrinologist accepting new pt's. Pt will follow up with endocrinologist at [facility]. Pt has no further CM needs at this time. 10/24/2014 0700 Jolene Provost, RN, MSN, CM Will continue to follow for CM needs.

## 2014-10-24 NOTE — Progress Notes (Signed)
PT IS WAITING FOR WIFE TO COME BACK BEFORE RECEIVING DISCHARGE INSTRUCTIONS. PICC LINE AND IV D/C'D. PT IS WAITING TO EAT LUNCH ALSO WHEN WIFE RETURNS.PT DEPRESSED AND SAD.

## 2014-10-24 NOTE — Progress Notes (Addendum)
Inpatient Diabetes Program Recommendations  AACE/ADA: New Consensus Statement on Inpatient Glycemic Control (2013)  Target Ranges:  Prepandial:   less than 140 mg/dL      Peak postprandial:   less than 180 mg/dL (1-2 hours)      Critically ill patients:  140 - 180 mg/dL   Results for Corey Warren, Corey Warren (MRN [id number]) as of 10/24/2014 08:21  Ref. Range 10/23/2014 07:31 10/23/2014 11:24 10/23/2014 16:33 10/23/2014 20:09 10/24/2014 07:36  Glucose-Capillary Latest Range: 70-99 mg/dL 164 (H) 171 (H) 249 (H) 234 (H) 251 (H)   Diabetes history: DM2 Outpatient Diabetes medications: Lantus 25 units daily, Novolog 20 units QAM with meal, Novolog 15 units QPM with meal Current orders for Inpatient glycemic control: Lantus 15 units QHS, Novolog 0-15 units TID with meals, Novolog 0-5 units QHS, Novolog 4 units TID with meals for meal coverage.  Inpatient Diabetes Program Recommendations Insulin - Basal: Please consider increasing Lantus to 18 units QHS. Insulin - Meal Coverage: Noted Novolog meal coverage was ordered yesterday and none was given until this morning with breakfast.  Thanks, Barnie Alderman, RN, MSN, CCRN, CDE Diabetes Coordinator Inpatient Diabetes Program [phone] (Team Pager) [phone] (AP office) [phone] [facility] office)

## 2014-10-24 NOTE — Discharge Summary (Addendum)
Physician Discharge Summary  Sol Englert ELF:[id number] DOB: 06-17-1951 DOA: 10/22/2014  PCP: No primary care provider on file.  Admit date: 10/22/2014 Discharge date: 10/24/2014  Time spent: 45 minutes  Recommendations for Outpatient Follow-up:  -Will be discharged home today. -Advised to follow up with PCP in 2 weeks. -Would recommend repeat CXR in 4-6 weeks to ensure complete resolution of PNA.   Discharge Diagnoses:  Principal Problem:   DKA (diabetic ketoacidoses) Active Problems:   CAP (community acquired pneumonia)   Cough   Hyperkalemia   Acute renal failure   Hyponatremia   Leukocytosis   Elevated bilirubin   Lactic acid acidosis   Discharge Condition: Stable and improved  Filed Weights   10/22/14 0713 10/23/14 0500 10/24/14 0530  Weight: 85.7 kg (188 lb 15 oz) 90.8 kg (200 lb 2.8 oz) 90.8 kg (200 lb 2.8 oz)    History of present illness:  64yo male with dm2, not taking his novolog apparently has had fever/cough since 3-4 days. Pt is brought into ED and found to have dka and leukocytosis.   Hospital Course:   DKA -Resolved. -Suspect precipitated by PNA. -Continue home doses of lantus and novolog. -Advised to keep a CBG log and bring to next appointment with PCP.  CAP -Still with mild cough. -Levaquin for 10 days on DC. -Leukocytosis resolved. -Would repeat CXR in 4 -6 weeks to ensure complete resolution.  Sinus Tachycardia -Suspect related to acute illness. -Has been started on metoprolol and tolerating well.  Hyperkalemia -Resolved with treatment of DKA.  Hyponatremia -Resolved, likely psuedo related to hyperglycemia.  Procedures:  None   Consultations:  None  Discharge Instructions      Discharge Instructions    Diet Carb Modified    Complete by:  As directed      Increase activity slowly    Complete by:  As directed             Medication List    TAKE these medications        aspirin EC 81 MG tablet  Take 162 mg by  mouth daily.     guaiFENesin 600 MG 12 hr tablet  Commonly known as:  MUCINEX  Take 1 tablet (600 mg total) by mouth 2 (two) times daily.     insulin aspart 100 UNIT/ML injection  Commonly known as:  novoLOG  Inject 15-20 Units into the skin 2 (two) times daily. 20 units in the morning and 15 units at night.     insulin glargine 100 UNIT/ML injection  Commonly known as:  LANTUS  Inject 25 Units into the skin daily.     levofloxacin 750 MG tablet  Commonly known as:  LEVAQUIN  Take 1 tablet (750 mg total) by mouth daily.     levothyroxine 50 MCG tablet  Commonly known as:  SYNTHROID, LEVOTHROID  Take 50 mcg by mouth daily before breakfast.     metoprolol tartrate 25 MG tablet  Commonly known as:  LOPRESSOR  Take 1 tablet (25 mg total) by mouth 2 (two) times daily.       Allergies  Allergen Reactions  . Codeine Nausea And Vomiting  . Morphine And Related Nausea And Vomiting   Follow-up Information    Schedule an appointment as soon as possible for a visit in 2 weeks to follow up.   Why:  With your regular physician       The results of significant diagnostics from this hospitalization (including imaging, microbiology, ancillary and  laboratory) are listed below for reference.    Significant Diagnostic Studies: Dg Chest 1 View  10/23/2014   CLINICAL DATA:  Shortness of breath, pneumonia, altered mental status  EXAM: CHEST - 1 VIEW  COMPARISON:  Portable chest x-ray of October 22, 2014  FINDINGS: The lungs are mildly hyperinflated. There remains confluent increased airspace opacity in the inferior aspect of the right upper lobe. Minimally increased parenchymal density in the left upper lobe is noted but less conspicuous. There is no pleural effusion. The cardiac silhouette remains mildly enlarged. The pulmonary vascularity is less prominent today. The trachea is midline. The right-sided PICC line tip projects over the midportion of the SVC.  IMPRESSION: There has not been  significant interval change in the appearance of the right upper lobe pneumonia since the previous study. Parenchymal density in the left upper lobe is less conspicuous today. Improving underlying pulmonary interstitial edema.   Electronically Signed   By: David  Martinique   On: 10/23/2014 07:53   Dg Chest Port 1 View  10/22/2014   CLINICAL DATA:  Right PICC placement.  Initial encounter.  EXAM: PORTABLE CHEST - 1 VIEW  COMPARISON:  Chest radiograph performed earlier today at 4:19 p.m.  FINDINGS: The patient's right PICC is noted ending about the mid to distal SVC.  Patchy bilateral airspace opacities are again seen, more prominent on the right, concerning for multifocal pneumonia. Underlying vascular congestion is noted. No pleural effusion or pneumothorax is identified.  The cardiomediastinal silhouette is borderline normal in size. There is slight unusual prominence of the right paratracheal soft tissues. This may simply reflect normal vasculature. No acute osseous abnormalities are seen.  IMPRESSION: 1. Right PICC noted ending about the mid to distal SVC. 2. Patchy bilateral airspace opacities, more prominent on the right, concerning for multifocal pneumonia. Underlying vascular congestion seen.   Electronically Signed   By: Garald Balding M.D.   On: 10/22/2014 18:43   Dg Chest Port 1 View  10/22/2014   CLINICAL DATA:  Weakness, shortness of breath, fever, and altered mental status.  EXAM: PORTABLE CHEST - 1 VIEW  COMPARISON:  None.  FINDINGS: Borderline heart size with normal pulmonary vascularity. No focal airspace disease or consolidation in the lungs. No blunting of costophrenic angles. No pneumothorax. Mediastinal contours appear intact. Calcified aorta.  IMPRESSION: Borderline heart size.  No evidence of active pulmonary disease.   Electronically Signed   By: Lucienne Capers M.D.   On: 10/22/2014 04:54   Dg Chest Port 1v Same Day  10/22/2014   CLINICAL DATA:  Fever and difficulty breathing  EXAM:  PORTABLE CHEST - 1 VIEW SAME DAY  COMPARISON:  Study obtained earlier in the day  FINDINGS: There is now patchy airspace opacity in the right upper lobe. A lesser degree of airspace opacity is noted in the left upper lobe. There is underlying emphysema. Heart is upper normal in size with pulmonary vascularity within normal limits. No adenopathy. No bone lesions.  IMPRESSION: Patchy airspace opacity in both upper lobes, more on the right than on the left, new from earlier in the day. Underlying emphysema. No change in cardiac silhouette.   Electronically Signed   By: Lowella Grip M.D.   On: 10/22/2014 16:56    Microbiology: Recent Results (from the past 240 hour(s))  Urine culture     Status: None   Collection Time: 10/22/14  4:28 AM  Result Value Ref Range Status   Specimen Description URINE, CLEAN CATCH  Final  Special Requests NONE  Final   Colony Count NO GROWTH Performed at [facility]   Final   Culture NO GROWTH Performed at [facility]   Final   Report Status 10/23/2014 FINAL  Final  Culture, blood (routine x 2)     Status: None (Preliminary result)   Collection Time: 10/22/14  4:53 AM  Result Value Ref Range Status   Specimen Description BLOOD LEFT ARM  Final   Special Requests BOTTLES DRAWN AEROBIC AND ANAEROBIC 6CC BOTTLES  Final   Culture NO GROWTH 2 DAYS  Final   Report Status PENDING  Incomplete  Culture, blood (routine x 2)     Status: None (Preliminary result)   Collection Time: 10/22/14  5:11 AM  Result Value Ref Range Status   Specimen Description BLOOD LEFT ARM  Final   Special Requests BOTTLES DRAWN AEROBIC ONLY 6CC BOTTLE  Final   Culture NO GROWTH 2 DAYS  Final   Report Status PENDING  Incomplete  MRSA PCR Screening     Status: None   Collection Time: 10/22/14  6:55 AM  Result Value Ref Range Status   MRSA by PCR NEGATIVE NEGATIVE Final    Comment:        The GeneXpert MRSA Assay (FDA approved for NASAL specimens only), is one  component of a comprehensive MRSA colonization surveillance program. It is not intended to diagnose MRSA infection nor to guide or monitor treatment for MRSA infections.      Labs: Basic Metabolic Panel:  Recent Labs Lab 10/22/14 0410 10/22/14 0727  10/23/14 0501  10/23/14 1303 10/23/14 1722 10/23/14 2048 10/24/14 0100 10/24/14 0415  NA 128* 137  < > 144  < > 142 138 139 138 138  K 5.8* 4.7  < > 3.8  < > 4.0 4.0 3.4* 3.4* 3.5  CL 87* 106  < > 116*  < > 111 109 106 106 104  CO2 6* 6*  < > 22  < > 23 24 25 27 26   GLUCOSE 1092* 765*  < > 178*  < > 210* 280* 245* 195* 249*  BUN 55* 51*  < > 31*  < > 25* 21 18 15 16   CREATININE 2.86* 2.31*  < > 0.85  < > 0.81 0.78 0.75 0.72 0.68  CALCIUM 8.8 7.9*  < > 8.4  < > 8.7 8.4 8.4 8.1* 8.3*  MG 3.4* 2.7*  --  2.2  --   --   --   --   --   --   PHOS 11.2*  --   --   --   --   --   --   --   --   --   < > = values in this interval not displayed. Liver Function Tests:  Recent Labs Lab 10/22/14 0410  AST 16  ALT 13  ALKPHOS 176*  BILITOT 2.1*  PROT 8.4*  ALBUMIN 3.7    Recent Labs Lab 10/22/14 0410  LIPASE 37   No results for input(s): AMMONIA in the last 168 hours. CBC:  Recent Labs Lab 10/22/14 0410 10/22/14 1211 10/22/14 2323 10/23/14 0501 10/23/14 1722 10/24/14 0415  WBC 25.4* 24.5* 18.9* 20.6* 15.9* 10.8*  NEUTROABS 21.1*  --   --   --   --   --   HGB 14.5 13.3 9.9* 11.5* 12.0* 11.5*  HCT 46.5 37.2* 28.5* 32.9* 34.7* 33.5*  MCV 105.2* 94.2 89.1 88.7 88.7 91.8  PLT 581* 394 305 328  329 324   Cardiac Enzymes:  Recent Labs Lab 10/22/14 0410 10/22/14 1443  TROPONINI <0.03 0.12*   BNP: BNP (last 3 results) No results for input(s): BNP in the last 8760 hours.  ProBNP (last 3 results) No results for input(s): PROBNP in the last 8760 hours.  CBG:  Recent Labs Lab 10/23/14 0731 10/23/14 1124 10/23/14 1633 10/23/14 2009 10/24/14 0736  GLUCAP 164* 171* 249* 234* 251*        Signed:  [facility] Pager: [phone] 10/24/2014, 11:14 AM

## 2014-10-24 NOTE — Progress Notes (Signed)
Physical Therapy Treatment Patient Details Name: Corey Warren MRN: [id number] DOB: Aug 11, 1951 Today's Date: 2014/11/09    History of Present Illness Pt is a 64 year old male who was admitted with ketoacidosis.  He is married and lives in [place], [place].  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  He states that he does not want to eat and that he has been "lying in bed for 4 days".    PT Comments    Patient is doing very well today, no longer need walker for gait. He ambulated functional distances on level surface and stair with modified independence. No further PT needed.  Follow Up Recommendations  No PT follow up     Equipment Recommendations  None recommended by PT    Recommendations for Other Services  none     Precautions / Restrictions Precautions Precautions: None Restrictions Weight Bearing Restrictions: No    Mobility  Bed Mobility Overal bed mobility: Independent                Transfers Overall transfer level: Independent                  Ambulation/Gait Ambulation/Gait assistance: Modified independent (Device/Increase time) Ambulation Distance (Feet): 400 Feet Assistive device: None Gait Pattern/deviations: WFL(Within Functional Limits)   Gait velocity interpretation: Below normal speed for age/gender     Stairs Stairs: Yes Stairs assistance: Modified independent (Device/Increase time) Stair Management: One rail Right;Alternating pattern;Forwards Number of Stairs: 10    Wheelchair Mobility    Modified Rankin (Stroke Patients Only)       Balance Overall balance assessment: Modified Independent                                  Cognition Arousal/Alertness: Awake/alert Behavior During Therapy: WFL for tasks assessed/performed Overall Cognitive Status: Within Functional Limits for tasks assessed                      Exercises      General Comments        Pertinent  Vitals/Pain Pain Assessment: No/denies pain    Home Living                      Prior Function            PT Goals (current goals can now be found in the care plan section) Progress towards PT goals: Goals met/education completed, patient discharged from PT    Frequency       PT Plan Other (comment) (Discharge from PT)    Co-evaluation             End of Session Equipment Utilized During Treatment: Gait belt Activity Tolerance: Patient tolerated treatment well Patient left: in chair;with call bell/phone within reach;with family/visitor present     Time: 2536-6440 PT Time Calculation (min) (ACUTE ONLY): 19 min  Charges:  $Gait Training: 8-22 mins                    G Codes:      Sable Feil 2014-11-09, 10:23 AM

## 2014-10-24 NOTE — Progress Notes (Signed)
PHARMACIST - PHYSICIAN COMMUNICATION DR:   Jerilee Hoh CONCERNING: Antibiotic IV to Oral Route Change Policy  RECOMMENDATION: This patient is receiving Zithromax by the intravenous route.  Based on criteria approved by the Pharmacy and Therapeutics Committee, the antibiotic(s) is/are being converted to the equivalent oral dose form(s).   DESCRIPTION: These criteria include:  Patient being treated for a respiratory tract infection, urinary tract infection, cellulitis or clostridium difficile associated diarrhea if on metronidazole  The patient is not neutropenic and does not exhibit a GI malabsorption state  The patient is eating (either orally or via tube) and/or has been taking other orally administered medications for a least 24 hours  The patient is improving clinically and has a Tmax < 100.5  If you have questions about this conversion, please contact the Pharmacy Department  [x]   [phone] )  [facility] []   [phone] )  [facility]  []   [phone] )  [facility] []   [phone] )  [place], PharmD, BCPS 10/24/2014@11 :08 AM

## 2014-10-28 LAB — CULTURE, BLOOD (ROUTINE X 2)
CULTURE: NO GROWTH
Culture: NO GROWTH

## 2014-12-25 ENCOUNTER — Emergency Department (HOSPITAL_COMMUNITY)
Admission: EM | Admit: 2014-12-25 | Discharge: 2014-12-25 | Disposition: A | Discharge status: Home / Self Care | Payer: BLUE CROSS/BLUE SHIELD | Attending: Emergency Medicine | Admitting: Emergency Medicine

## 2014-12-25 ENCOUNTER — Encounter (HOSPITAL_COMMUNITY): Payer: Self-pay | Admitting: Emergency Medicine

## 2014-12-25 DIAGNOSIS — E079 Disorder of thyroid, unspecified: Secondary | ICD-10-CM | POA: Diagnosis not present

## 2014-12-25 DIAGNOSIS — Z87891 Personal history of nicotine dependence: Secondary | ICD-10-CM | POA: Diagnosis not present

## 2014-12-25 DIAGNOSIS — M25531 Pain in right wrist: Secondary | ICD-10-CM | POA: Insufficient documentation

## 2014-12-25 DIAGNOSIS — Z792 Long term (current) use of antibiotics: Secondary | ICD-10-CM | POA: Insufficient documentation

## 2014-12-25 DIAGNOSIS — R509 Fever, unspecified: Secondary | ICD-10-CM | POA: Diagnosis not present

## 2014-12-25 DIAGNOSIS — M7989 Other specified soft tissue disorders: Secondary | ICD-10-CM | POA: Diagnosis present

## 2014-12-25 DIAGNOSIS — M791 Myalgia: Secondary | ICD-10-CM | POA: Insufficient documentation

## 2014-12-25 DIAGNOSIS — Z79899 Other long term (current) drug therapy: Secondary | ICD-10-CM | POA: Insufficient documentation

## 2014-12-25 DIAGNOSIS — Z7982 Long term (current) use of aspirin: Secondary | ICD-10-CM | POA: Insufficient documentation

## 2014-12-25 DIAGNOSIS — E119 Type 2 diabetes mellitus without complications: Secondary | ICD-10-CM | POA: Insufficient documentation

## 2014-12-25 DIAGNOSIS — M25432 Effusion, left wrist: Secondary | ICD-10-CM | POA: Insufficient documentation

## 2014-12-25 DIAGNOSIS — Z794 Long term (current) use of insulin: Secondary | ICD-10-CM | POA: Diagnosis not present

## 2014-12-25 DIAGNOSIS — R61 Generalized hyperhidrosis: Secondary | ICD-10-CM | POA: Diagnosis not present

## 2014-12-25 DIAGNOSIS — Z87448 Personal history of other diseases of urinary system: Secondary | ICD-10-CM | POA: Insufficient documentation

## 2014-12-25 LAB — CBC WITH DIFFERENTIAL/PLATELET
Basophils Absolute: 0 10*3/uL (ref 0.0–0.1)
Basophils Relative: 1 % (ref 0–1)
Eosinophils Absolute: 0.1 10*3/uL (ref 0.0–0.7)
Eosinophils Relative: 2 % (ref 0–5)
HEMATOCRIT: 41.5 % (ref 39.0–52.0)
HEMOGLOBIN: 14 g/dL (ref 13.0–17.0)
LYMPHS ABS: 1.2 10*3/uL (ref 0.7–4.0)
LYMPHS PCT: 17 % (ref 12–46)
MCH: 30.7 pg (ref 26.0–34.0)
MCHC: 33.7 g/dL (ref 30.0–36.0)
MCV: 91 fL (ref 78.0–100.0)
MONOS PCT: 10 % (ref 3–12)
Monocytes Absolute: 0.7 10*3/uL (ref 0.1–1.0)
Neutro Abs: 5 10*3/uL (ref 1.7–7.7)
Neutrophils Relative %: 70 % (ref 43–77)
Platelets: 327 10*3/uL (ref 150–400)
RBC: 4.56 MIL/uL (ref 4.22–5.81)
RDW: 13.1 % (ref 11.5–15.5)
WBC: 7 10*3/uL (ref 4.0–10.5)

## 2014-12-25 LAB — BASIC METABOLIC PANEL
Anion gap: 9 (ref 5–15)
BUN: 7 mg/dL (ref 6–23)
CALCIUM: 9.1 mg/dL (ref 8.4–10.5)
CO2: 29 mmol/L (ref 19–32)
Chloride: 92 mmol/L — ABNORMAL LOW (ref 96–112)
Creatinine, Ser: 0.83 mg/dL (ref 0.50–1.35)
GFR calc Af Amer: >90 mL/min (ref >90)
Glucose, Bld: 367 mg/dL — ABNORMAL HIGH (ref 70–99)
POTASSIUM: 3.5 mmol/L (ref 3.5–5.1)
SODIUM: 130 mmol/L — AB (ref 135–145)

## 2014-12-25 LAB — URIC ACID: Uric Acid, Serum: 3.4 mg/dL — ABNORMAL LOW (ref 4.0–7.8)

## 2014-12-25 MED ORDER — HYDROCODONE-ACETAMINOPHEN 5-325 MG PO TABS
2.0000 | ORAL_TABLET | Freq: Once | ORAL | Status: AC
  Administered 2014-12-25: 2 via ORAL
  Filled 2014-12-25: qty 2

## 2014-12-25 MED ORDER — OXYCODONE-ACETAMINOPHEN 5-325 MG PO TABS: 1.0000 | ORAL_TABLET | ORAL | Status: DC | PRN

## 2014-12-25 MED ORDER — IBUPROFEN 400 MG PO TABS: 400.0000 mg | ORAL_TABLET | Freq: Four times a day (QID) | ORAL | Status: DC | PRN

## 2014-12-25 NOTE — ED Notes (Signed)
Pt verbalized understanding of no driving and to use caution within 4 hours of taking pain meds due to meds cause drowsiness 

## 2014-12-25 NOTE — ED Notes (Signed)
Patient complaining of bilateral feet swelling and left hand swelling x 5 days. Patient complaining of pain in left hand. Denies injury.

## 2014-12-25 NOTE — ED Provider Notes (Addendum)
CSN: [id number]     Arrival date & time 12/25/14  1102 History  This chart was scribed for Lacretia Leigh, MD by Chester Holstein, ED Scribe. This patient was seen in room APA12/APA12 and the patient's care was started at 11:48 AM.    Chief Complaint  Patient presents with  . Joint Swelling    The history is provided by the patient. No language interpreter was used.   HPI Comments: Corey Warren is a 64 y.o. male with PMHx of NIDDM, thyroid disease, and renal disorder who presents to the Emergency Department complaining of intermittent bilateral foot swelling last week which has resolved and left hand swelling with onset 3 days ago. Pt states he had to walk with a cane. Pt reports needed to ambulate with a cane. Pt states pain and swelling started in index finger and moved to wrist. Pt notes associated pain. Pt notes movement aggravates the pain.  Pt reports subjective fever and diaphoresis this morning. Pt has tried icy hot cream for relief. Pt takes HCTZ for swelling. Pt denies any known injury and any other joint pain currently.    Past Medical History  Diagnosis Date  . Diabetes mellitus without complication   . Thyroid disease   . Renal disorder    Past Surgical History  Procedure Laterality Date  . Cholecystectomy     Family History  Problem Relation Age of Onset  . Family history unknown: Yes   History  Substance Use Topics  . Smoking status: Former Research scientist (life sciences)  . Smokeless tobacco: Not on file  . Alcohol Use: No    Review of Systems  Constitutional: Positive for fever (subjective resolved) and diaphoresis.  Musculoskeletal: Positive for myalgias, joint swelling and arthralgias.  All other systems reviewed and are negative.     Allergies  Aspirin; Codeine; and Morphine and related  Home Medications   Prior to Admission medications   Medication Sig Start Date End Date Taking? Authorizing Provider  aspirin EC 81 MG tablet Take 162 mg by mouth daily.    Historical Provider, MD   guaiFENesin (MUCINEX) 600 MG 12 hr tablet Take 1 tablet (600 mg total) by mouth 2 (two) times daily. 10/24/14   Erline Hau, MD  insulin aspart (NOVOLOG) 100 UNIT/ML injection Inject 15-20 Units into the skin 2 (two) times daily. 20 units in the morning and 15 units at night.    Historical Provider, MD  insulin glargine (LANTUS) 100 UNIT/ML injection Inject 25 Units into the skin daily.     Historical Provider, MD  levofloxacin (LEVAQUIN) 750 MG tablet Take 1 tablet (750 mg total) by mouth daily. 10/24/14   Erline Hau, MD  levothyroxine (SYNTHROID, LEVOTHROID) 50 MCG tablet Take 50 mcg by mouth daily before breakfast.    Historical Provider, MD  metoprolol tartrate (LOPRESSOR) 25 MG tablet Take 1 tablet (25 mg total) by mouth 2 (two) times daily. 10/24/14   Erline Hau, MD   BP 162/93 mmHg  Pulse 115  Temp(Src) 98.1 F (36.7 C) (Oral)  Resp 16  SpO2 99% Physical Exam  Constitutional: He is oriented to person, place, and time. He appears well-developed and well-nourished.  Non-toxic appearance. No distress.  HENT:  Head: Normocephalic and atraumatic.  Eyes: Conjunctivae, EOM and lids are normal. Pupils are equal, round, and reactive to light.  Neck: Normal range of motion. Neck supple. No tracheal deviation present. No thyroid mass present.  Cardiovascular: Normal rate, regular rhythm and normal heart sounds.  Exam reveals no gallop.   No murmur heard. Pulses:      Radial pulses are 2+ on the left side.  Pulmonary/Chest: Effort normal and breath sounds normal. No stridor. No respiratory distress. He has no decreased breath sounds. He has no wheezes. He has no rhonchi. He has no rales.  Abdominal: Soft. Normal appearance and bowel sounds are normal. He exhibits no distension. There is no tenderness. There is no rebound and no CVA tenderness.  Musculoskeletal: He exhibits no edema.       Left wrist: He exhibits decreased range of motion (due to pain),  tenderness (and erythema) and swelling (slight edema noted).  Neurological: He is alert and oriented to person, place, and time. He has normal strength. No cranial nerve deficit or sensory deficit. GCS eye subscore is 4. GCS verbal subscore is 5. GCS motor subscore is 6.  Skin: Skin is warm and dry. No abrasion and no rash noted.  Psychiatric: He has a normal mood and affect. His speech is normal and behavior is normal.  Nursing note and vitals reviewed.   ED Course  Procedures (including critical care time) DIAGNOSTIC STUDIES: Oxygen Saturation is 99% on room air, normal by my interpretation.    COORDINATION OF CARE: 11:56 AM Discussed treatment plan with patient at beside, the patient agrees with the plan and has no further questions at this time.   Labs Review Labs Reviewed  CBC WITH DIFFERENTIAL/PLATELET  BASIC METABOLIC PANEL  URIC ACID    Imaging Review No results found.   EKG Interpretation None     MDM   Final diagnoses:  None    I personally performed the services described in this documentation, which was scribed in my presence. The recorded information has been reviewed and is accurate.    2:20 PM Pt given medications and feels better--do not think that the patient has a septic joint this time. Suspect a nonspecific arthritis and will treat patient for that.  Patient denies any known history of anaphylaxis to take an aspirin. I discussed at length with him about whether not to give him Motrin and he said he will call his physician to make sure that it is okay to take that.  Lacretia Leigh, MD 12/25/14 [employer], MD 12/25/14 [phone]

## 2014-12-25 NOTE — Discharge Instructions (Signed)
Return here at once for fever above 101, increased redness or pain to your left wrist, or any other problems.   Arthralgia Your caregiver has diagnosed you as suffering from an arthralgia. Arthralgia means there is pain in a joint. This can come from many reasons including:  Bruising the joint which causes soreness (inflammation) in the joint.  Wear and tear on the joints which occur as we grow older (osteoarthritis).  Overusing the joint.  Various forms of arthritis.  Infections of the joint. Regardless of the cause of pain in your joint, most of these different pains respond to anti-inflammatory drugs and rest. The exception to this is when a joint is infected, and these cases are treated with antibiotics, if it is a bacterial infection. HOME CARE INSTRUCTIONS   Rest the injured area for as long as directed by your caregiver. Then slowly start using the joint as directed by your caregiver and as the pain allows. Crutches as directed may be useful if the ankles, knees or hips are involved. If the knee was splinted or casted, continue use and care as directed. If an stretchy or elastic wrapping bandage has been applied today, it should be removed and re-applied every 3 to 4 hours. It should not be applied tightly, but firmly enough to keep swelling down. Watch toes and feet for swelling, bluish discoloration, coldness, numbness or excessive pain. If any of these problems (symptoms) occur, remove the ace bandage and re-apply more loosely. If these symptoms persist, contact your caregiver or return to this location.  For the first 24 hours, keep the injured extremity elevated on pillows while lying down.  Apply ice for 15-20 minutes to the sore joint every couple hours while awake for the first half day. Then 03-04 times per day for the first 48 hours. Put the ice in a plastic bag and place a towel between the bag of ice and your skin.  Wear any splinting, casting, elastic bandage  applications, or slings as instructed.  Only take over-the-counter or prescription medicines for pain, discomfort, or fever as directed by your caregiver. Do not use aspirin immediately after the injury unless instructed by your physician. Aspirin can cause increased bleeding and bruising of the tissues.  If you were given crutches, continue to use them as instructed and do not resume weight bearing on the sore joint until instructed. Persistent pain and inability to use the sore joint as directed for more than 2 to 3 days are warning signs indicating that you should see a caregiver for a follow-up visit as soon as possible. Initially, a hairline fracture (break in bone) may not be evident on X-rays. Persistent pain and swelling indicate that further evaluation, non-weight bearing or use of the joint (use of crutches or slings as instructed), or further X-rays are indicated. X-rays may sometimes not show a small fracture until a week or 10 days later. Make a follow-up appointment with your own caregiver or one to whom we have referred you. A radiologist (specialist in reading X-rays) may read your X-rays. Make sure you know how you are to obtain your X-ray results. Do not assume everything is normal if you do not hear from [place]. SEEK MEDICAL CARE IF: Bruising, swelling, or pain increases. SEEK IMMEDIATE MEDICAL CARE IF:   Your fingers or toes are numb or blue.  The pain is not responding to medications and continues to stay the same or get worse.  The pain in your joint becomes severe.  You  develop a fever over 102 F (38.9 C).  It becomes impossible to move or use the joint. MAKE SURE YOU:   Understand these instructions.  Will watch your condition.  Will get help right away if you are not doing well or get worse. Document Released: 09/08/2005 Document Revised: 12/01/2011 Document Reviewed: 04/26/2008 [facility] Patient Information 2015 [place], [place]. This information is not intended to  replace advice given to you by your health care provider. Make sure you discuss any questions you have with your health care provider.

## 2015-12-19 ENCOUNTER — Emergency Department (HOSPITAL_COMMUNITY)
Admission: EM | Admit: 2015-12-19 | Discharge: 2015-12-19 | Disposition: A | Discharge status: Home / Self Care | Payer: BLUE CROSS/BLUE SHIELD | Attending: Emergency Medicine | Admitting: Emergency Medicine

## 2015-12-19 ENCOUNTER — Emergency Department (HOSPITAL_COMMUNITY): Payer: BLUE CROSS/BLUE SHIELD

## 2015-12-19 ENCOUNTER — Encounter (HOSPITAL_COMMUNITY): Payer: Self-pay | Admitting: *Deleted

## 2015-12-19 DIAGNOSIS — I10 Essential (primary) hypertension: Secondary | ICD-10-CM | POA: Diagnosis not present

## 2015-12-19 DIAGNOSIS — N23 Unspecified renal colic: Secondary | ICD-10-CM

## 2015-12-19 DIAGNOSIS — E119 Type 2 diabetes mellitus without complications: Secondary | ICD-10-CM | POA: Insufficient documentation

## 2015-12-19 DIAGNOSIS — Z79899 Other long term (current) drug therapy: Secondary | ICD-10-CM | POA: Diagnosis not present

## 2015-12-19 DIAGNOSIS — R112 Nausea with vomiting, unspecified: Secondary | ICD-10-CM | POA: Insufficient documentation

## 2015-12-19 DIAGNOSIS — R1030 Lower abdominal pain, unspecified: Secondary | ICD-10-CM | POA: Diagnosis present

## 2015-12-19 DIAGNOSIS — N390 Urinary tract infection, site not specified: Secondary | ICD-10-CM | POA: Diagnosis not present

## 2015-12-19 DIAGNOSIS — Z794 Long term (current) use of insulin: Secondary | ICD-10-CM | POA: Insufficient documentation

## 2015-12-19 DIAGNOSIS — Z7722 Contact with and (suspected) exposure to environmental tobacco smoke (acute) (chronic): Secondary | ICD-10-CM | POA: Diagnosis not present

## 2015-12-19 HISTORY — DX: Essential (primary) hypertension: I10

## 2015-12-19 LAB — URINALYSIS, ROUTINE W REFLEX MICROSCOPIC
Bilirubin Urine: NEGATIVE
Glucose, UA: >1000 mg/dL — AB
KETONES UR: 40 mg/dL — AB
NITRITE: POSITIVE — AB
PROTEIN: NEGATIVE mg/dL
Specific Gravity, Urine: 1.005 (ref 1.005–1.030)
pH: 7 (ref 5.0–8.0)

## 2015-12-19 LAB — CBC WITH DIFFERENTIAL/PLATELET
BASOS PCT: 0 %
Basophils Absolute: 0 10*3/uL (ref 0.0–0.1)
EOS ABS: 0 10*3/uL (ref 0.0–0.7)
Eosinophils Relative: 0 %
HCT: 41.1 % (ref 39.0–52.0)
HEMOGLOBIN: 14 g/dL (ref 13.0–17.0)
Lymphocytes Relative: 6 %
Lymphs Abs: 0.7 10*3/uL (ref 0.7–4.0)
MCH: 30.5 pg (ref 26.0–34.0)
MCHC: 34.1 g/dL (ref 30.0–36.0)
MCV: 89.5 fL (ref 78.0–100.0)
Monocytes Absolute: 0.5 10*3/uL (ref 0.1–1.0)
Monocytes Relative: 4 %
NEUTROS PCT: 90 %
Neutro Abs: 10.2 10*3/uL — ABNORMAL HIGH (ref 1.7–7.7)
PLATELETS: 280 10*3/uL (ref 150–400)
RBC: 4.59 MIL/uL (ref 4.22–5.81)
RDW: 13.4 % (ref 11.5–15.5)
WBC: 11.5 10*3/uL — AB (ref 4.0–10.5)

## 2015-12-19 LAB — URINE MICROSCOPIC-ADD ON: Squamous Epithelial / LPF: NONE SEEN

## 2015-12-19 LAB — BASIC METABOLIC PANEL
ANION GAP: 10 (ref 5–15)
BUN: 14 mg/dL (ref 6–20)
CO2: 24 mmol/L (ref 22–32)
Calcium: 8.7 mg/dL — ABNORMAL LOW (ref 8.9–10.3)
Chloride: 102 mmol/L (ref 101–111)
Creatinine, Ser: 0.93 mg/dL (ref 0.61–1.24)
GFR calc Af Amer: >60 mL/min (ref >60)
GFR calc non Af Amer: >60 mL/min (ref >60)
GLUCOSE: 312 mg/dL — AB (ref 65–99)
POTASSIUM: 3.7 mmol/L (ref 3.5–5.1)
Sodium: 136 mmol/L (ref 135–145)

## 2015-12-19 LAB — CBG MONITORING, ED
Glucose-Capillary: 306 mg/dL — ABNORMAL HIGH (ref 65–99)
Glucose-Capillary: 262 mg/dL — ABNORMAL HIGH (ref 65–99)

## 2015-12-19 MED ORDER — OXYCODONE-ACETAMINOPHEN 5-325 MG PO TABS: 1.0000 | ORAL_TABLET | Freq: Four times a day (QID) | ORAL | Status: DC | PRN

## 2015-12-19 MED ORDER — CEPHALEXIN 500 MG PO CAPS: 500.0000 mg | ORAL_CAPSULE | Freq: Four times a day (QID) | ORAL | Status: DC

## 2015-12-19 MED ORDER — FENTANYL CITRATE (PF) 100 MCG/2ML IJ SOLN
50.0000 ug | Freq: Once | INTRAMUSCULAR | Status: AC
  Administered 2015-12-19: 50 ug via INTRAVENOUS
  Filled 2015-12-19: qty 2

## 2015-12-19 MED ORDER — DEXTROSE 5 % IV SOLN
1.0000 g | Freq: Once | INTRAVENOUS | Status: AC
  Administered 2015-12-19: 1 g via INTRAVENOUS
  Filled 2015-12-19: qty 10

## 2015-12-19 MED ORDER — PROCHLORPERAZINE EDISYLATE 5 MG/ML IJ SOLN
10.0000 mg | Freq: Once | INTRAMUSCULAR | Status: AC
  Administered 2015-12-19: 10 mg via INTRAVENOUS
  Filled 2015-12-19: qty 2

## 2015-12-19 NOTE — ED Notes (Signed)
Pt reports urinary retention this morning. States he can't empty his bladder completely. Also reports vomiting also .

## 2015-12-19 NOTE — ED Notes (Signed)
Patient with no complaints at this time. Respirations even and unlabored. Skin warm/dry. Discharge instructions reviewed with patient at this time. Patient given opportunity to voice concerns/ask questions. IV removed per policy and band-aid applied to site. Patient discharged at this time and left Emergency Department with steady gait.  

## 2015-12-19 NOTE — Discharge Instructions (Signed)
Your CT scan reveals a kidney stone present on the left that has traveled near your Bladder.. You also have a stone in the right kidney. Please see the [facility], or the urologist of your choice for follow-up of this problem. You have a urinary tract infection. Please use Keflex 4 times daily with food until all taken. Please increase fluids. Please asked to urology specialist, or your primary care specialist to recheck your urine in 7 days. Please strain all urine. If you should pass the stone before seeing the urologist, please take the stone with you to the urology office. Renal Colic Renal colic is pain that is caused by passing a kidney stone. The pain can be sharp and severe. It may be felt in the back, abdomen, side (flank), or groin. It can cause nausea. Renal colic can come and go. HOME CARE INSTRUCTIONS Watch your condition for any changes. The following actions may help to lessen any discomfort that you are feeling:  Take medicines only as directed by your health care provider.  Ask your health care provider if it is okay to take over-the-counter pain medicine.  Drink enough fluid to keep your urine clear or pale yellow. Drink 6-8 glasses of water each day.  Limit the amount of salt that you eat to less than 2 grams per day.  Reduce the amount of protein in your diet. Eat less meat, fish, nuts, and dairy.  Avoid foods such as spinach, rhubarb, nuts, or bran. These may make kidney stones more likely to form. SEEK MEDICAL CARE IF:  You have a fever or chills.  Your urine smells bad or looks cloudy.  You have pain or burning when you pass urine. SEEK IMMEDIATE MEDICAL CARE IF:  Your flank pain or groin pain suddenly worsens.  You become confused or disoriented or you lose consciousness.   This information is not intended to replace advice given to you by your health care provider. Make sure you discuss any questions you have with your health care provider.     Document Released: 06/18/2005 Document Revised: 09/29/2014 Document Reviewed: 07/19/2014 Elsevier Interactive Patient Education [employer].

## 2015-12-19 NOTE — ED Provider Notes (Signed)
CSN: [id number]     Arrival date & time 12/19/15  0747 History   First MD Initiated Contact with Patient 12/19/15 [phone]     Chief Complaint  Patient presents with  . Flank Pain     (Consider location/radiation/quality/duration/timing/severity/associated sxs/prior Treatment) HPI Comments: Patient is a 65 year old male who presents to the emergency department with flank pain and bladder area pain.  The patient states that he was awakened about 4:00 this morning with pain in his flank area and in his suprapubic area. The patient states that he's been able to "dribble a little bit" but has problems completely emptying his bladder. He states the pain is getting progressively worse. It is of note that he has had kidney stones in the past, also note that he is an insulin requiring diabetic. The patient denies any high fever or chills reported. He has had some nausea on, as well as some vomiting. His spouse states that his urine has "funkey color" but he is unsure of actually seeing blood. The patient has not taken any medication for this up to this point.  Patient is a 65 y.o. male presenting with flank pain. The history is provided by the patient and the spouse.  Flank Pain This is a new problem. The current episode started today.    Past Medical History  Diagnosis Date  . Diabetes mellitus without complication ([place])   . Thyroid disease   . Renal disorder   . Hypertension    Past Surgical History  Procedure Laterality Date  . Cholecystectomy     Family History  Problem Relation Age of Onset  . Family history unknown: Yes   Social History  Substance Use Topics  . Smoking status: Former Research scientist (life sciences)  . Smokeless tobacco: None  . Alcohol Use: No    Review of Systems  Genitourinary: Positive for flank pain and difficulty urinating.  All other systems reviewed and are negative.     Allergies  Aspirin; Codeine; and Morphine and related  Home Medications   Prior to Admission  medications   Medication Sig Start Date End Date Taking? Authorizing Provider  guaiFENesin (MUCINEX) 600 MG 12 hr tablet Take 1 tablet (600 mg total) by mouth 2 (two) times daily. 10/24/14   Erline Hau, MD  hydrochlorothiazide (MICROZIDE) 12.5 MG capsule Take 12.5 mg by mouth daily.    Historical Provider, MD  ibuprofen (ADVIL,MOTRIN) 400 MG tablet Take 1 tablet (400 mg total) by mouth every 6 (six) hours as needed. 12/25/14   Lacretia Leigh, MD  insulin aspart (NOVOLOG) 100 UNIT/ML injection Inject 14-28 Units into the skin 3 (three) times daily with meals. 28 units in the morning and 14 units at lunch 28 units before dinner. Sliding scale: 201-250=2u 251-300=4u 301-350=6u 351-400=8u >400=10u    Historical Provider, MD  insulin glargine (LANTUS) 100 UNIT/ML injection Inject 25 Units into the skin daily.     Historical Provider, MD  levofloxacin (LEVAQUIN) 750 MG tablet Take 1 tablet (750 mg total) by mouth daily. Patient not taking: Reported on 12/25/2014 10/24/14   Erline Hau, MD  metoprolol tartrate (LOPRESSOR) 25 MG tablet Take 1 tablet (25 mg total) by mouth 2 (two) times daily. Patient not taking: Reported on 12/25/2014 10/24/14   Erline Hau, MD  oxyCODONE-acetaminophen (PERCOCET/ROXICET) 5-325 MG per tablet Take 1-2 tablets by mouth every 4 (four) hours as needed for severe pain. 12/25/14   Lacretia Leigh, MD   There were no vitals taken for this  visit. Physical Exam  Constitutional: He is oriented to person, place, and time. He appears well-developed and well-nourished.  Non-toxic appearance.  HENT:  Head: Normocephalic.  Right Ear: Tympanic membrane and external ear normal.  Left Ear: Tympanic membrane and external ear normal.  Eyes: EOM and lids are normal. Pupils are equal, round, and reactive to light.  Neck: Normal range of motion. Neck supple. Carotid bruit is not present.  Cardiovascular: Normal rate, regular rhythm, normal heart sounds, intact  distal pulses and normal pulses.   Pulmonary/Chest: Breath sounds normal. No respiratory distress.  Abdominal: Soft. Bowel sounds are normal. There is no tenderness. There is no guarding.  Right and left CVAT. Pain to palpation of the suprapubic area.  Genitourinary: Testes normal and penis normal. Circumcised. No penile tenderness. No discharge found.  Musculoskeletal: Normal range of motion.  Lymphadenopathy:       Head (right side): No submandibular adenopathy present.       Head (left side): No submandibular adenopathy present.    He has no cervical adenopathy.  Neurological: He is alert and oriented to person, place, and time. He has normal strength. No cranial nerve deficit or sensory deficit.  Skin: Skin is warm and dry.  Psychiatric: His speech is normal. His mood appears anxious.  Nursing note and vitals reviewed.   ED Course  Procedures (including critical care time) Labs Review Labs Reviewed  CBG MONITORING, ED - Abnormal; Notable for the following:    Glucose-Capillary 306 (*)    All other components within normal limits  URINALYSIS, ROUTINE W REFLEX MICROSCOPIC (NOT AT [facility])    Imaging Review No results found. I have personally reviewed and evaluated these images and lab results as part of my medical decision-making.   EKG Interpretation None      MDM  Patient resting after IV pain medication.  Capillary blood glucose is 306. Basic metabolic panel shows glucose to be 312, otherwise within normal limits. Complete blood count shows an elevation of the white blood cell count of 11,500. There is no shift to the left. Urine analysis shows a clear aren't sure specimen with a specific gravity of 1.005. The patient is spilling greater than 1000 mg/daL of glucose. The nitrates are positive, the leukocytes are small. A culture of the urine has been sent to the lab. There is a large hemoglobin measured on the stick, there are 6-30 red cells present on microscopic  examination.  The CT scan for kidney stones shows a 3 mm stone in the left UVJ. There is left. Nephrotic stranding and stranding around the left ureter. There is also noticed a nonobstructive right kidney stone present.  The patient continues to rest well after the pain medications. He is easily aroused. I discussed the findings of the labs and the CT scan with the patient. The plan at this time is for the patient to be treated with IV Rocephin. A prescription for Keflex and Percocet is given to the patient. The patient is asked to strain all urine. He is referred to [facility]. He is given the option of seeing a urologist in his local area of [place], or seeing the [facility] specialist for follow-up and recheck. The patient he knowledge is the discharge instructions, and is in agreement.    Final diagnoses:  Renal colic  UTI (lower urinary tract infection)    *I have reviewed nursing notes, vital signs, and all appropriate lab and imaging results for this patient.Meryl Crutch  Mitzi Davenport 12/19/15 Sarah Ann, MD 12/19/15 [phone]

## 2015-12-20 LAB — URINE CULTURE: Culture: NO GROWTH

## 2016-01-02 ENCOUNTER — Ambulatory Visit (INDEPENDENT_AMBULATORY_CARE_PROVIDER_SITE_OTHER): Payer: BLUE CROSS/BLUE SHIELD | Admitting: Urology

## 2016-01-02 DIAGNOSIS — N200 Calculus of kidney: Secondary | ICD-10-CM | POA: Diagnosis not present

## 2016-01-10 ENCOUNTER — Telehealth: Payer: Self-pay | Admitting: Urology

## 2016-01-10 ENCOUNTER — Other Ambulatory Visit: Payer: Self-pay

## 2016-01-10 DIAGNOSIS — N200 Calculus of kidney: Secondary | ICD-10-CM

## 2016-01-10 NOTE — Telephone Encounter (Signed)
Pt advised of pre op date/time and sx date. Sx: 01/16/16 @ 12:00-2:00pm with dr Mont Dutton.  Pre op: 01/11/16 @ 2:15pm Post Op: 01/23/16 @ 1:45pm.   No authorization is required per Elta Guadeloupe with [facility]--[phone] 01/10/16 @ 2:57pm.

## 2016-01-10 NOTE — Patient Instructions (Signed)
Corey Warren  01/10/2016     @PREFPERIOPPHARMACY @   Your procedure is scheduled on 01/16/15  Report to [facility] at 52 A.M.  Call this number if you have problems the morning of surgery:  [phone]   Remember:  Do not eat food or drink liquids after midnight.  Take these medicines the morning of surgery with A SIP OF WATER none   Do not wear jewelry, make-up or nail polish.  Do not wear lotions, powders, or perfumes.  You may wear deodorant.  Do not shave 48 hours prior to surgery.  Men may shave face and neck.  Do not bring valuables to the hospital.  [facility] is not responsible for any belongings or valuables.  Contacts, dentures or bridgework may not be worn into surgery.  Leave your suitcase in the car.  After surgery it may be brought to your room.  For patients admitted to the hospital, discharge time will be determined by your treatment team.  Patients discharged the day of surgery will not be allowed to drive home.   Name and phone number of your driver:   family Special instructions:   How to Use Chlorhexidine Before Surgery Chlorhexidine is a germ-killing(antiseptic) solution that is used to clean the skin. It gets rid of the bacteria that usually live on the skin. Because your skin needs to be very clean before you have surgery, your caregiver may give you chlorhexidine to wash with before the surgery. You may be given chlorhexidine to use at home. For example, you may be told to wash with chlorhexidine the night before and the morning of your surgery. Washing with chlorhexidine before surgery helps prevent infections from developing after the surgery.  HOW TO USE CHLORHEXIDINE  Only use chlorhexidine as directed by your caregiver. Do not use more chlorhexidine than you are told to, and do not use it more often than you are told. Make sure you follow the instructions on the label. If you have any questions, call your caregiver. You can use chlorhexidine while  taking a shower or a bath. Unless instructed otherwise, follow these steps when using chlorhexidine: 1. Wash your face and your hair using the soap and shampoo that you normally use. 2. Turn off the shower or stand up in the tub. Then, wash with chlorhexidine. To wash with chlorhexidine:  If you were given a special sponge, use that. If you were not given a sponge, use only a cloth. Do not use any sort of brush. If the sponge has a brush on one side, do not use that side.  Start at your neck and scrub down to your toes. Avoid your genital area. Chlorhexidine can cause a skin reaction. Also, avoid any areas of skin that have cuts or scrapes.  Pay special attention to the part of your body where you will be having surgery. Scrub this area for at least 1 minute.  Be sure to get your back and under your arms.  Do not use chlorhexidine on your head. If the solution gets into your ears or eyes, rinse them well with water. Chlorhexidine can cause hearing lossif the solution gets into your ear. It can also harm your eye if it gets in your eye and is not rinsed out.  Rinse your body completely in the shower or tub. Be sure that all body creases and crevices are rinsed well. 3. Dry off with a clean towel. Do not put any substances on your body afterward, such  as powder, lotion, or perfume. 4. Put on clean clothing. 5. If it is the night before your surgery, sleep in clean sheets. SEEK MEDICAL CARE IF:   Your skin gets irritated after scrubbing. SEEK IMMEDIATE MEDICAL CARE IF:   Your eyes become very red or swollen.  Your eyes itch badly.  Your skin itches badly and is red or swollen.  Your hearing changes.  You have trouble seeing.  You have trouble breathing.  You swallow any chlorhexidine.   This information is not intended to replace advice given to you by your health care provider. Make sure you discuss any questions you have with your health care provider.   Document Released:  06/02/2012 Document Revised: 09/29/2014 Document Reviewed: 06/02/2012 Elsevier Interactive Patient Education [employer].   Please read over the following fact sheets that you were given. Surgical Site Infection Prevention, Anesthesia Post-op Instructions and Care and Recovery After Surgery      PATIENT INSTRUCTIONS POST-ANESTHESIA  IMMEDIATELY FOLLOWING SURGERY:  Do not drive or operate machinery for the first twenty four hours after surgery.  Do not make any important decisions for twenty four hours after surgery or while taking narcotic pain medications or sedatives.  If you develop intractable nausea and vomiting or a severe headache please notify your doctor immediately.  FOLLOW-UP:  Please make an appointment with your surgeon as instructed. You do not need to follow up with anesthesia unless specifically instructed to do so.  WOUND CARE INSTRUCTIONS (if applicable):  Keep a dry clean dressing on the anesthesia/puncture wound site if there is drainage.  Once the wound has quit draining you may leave it open to air.  Generally you should leave the bandage intact for twenty four hours unless there is drainage.  If the epidural site drains for more than 36-48 hours please call the anesthesia department.  QUESTIONS?:  Please feel free to call your physician or the hospital operator if you have any questions, and they will be happy to assist you.      Cystoscopy Cystoscopy is a procedure that is used to help your caregiver diagnose and sometimes treat conditions that affect your lower urinary tract. Your lower urinary tract includes your bladder and the tube through which urine passes from your bladder out of your body (urethra). Cystoscopy is performed with a thin, tube-shaped instrument (cystoscope). The cystoscope has lenses and a light at the end so that your caregiver can see inside your bladder. The cystoscope is inserted at the entrance of your urethra. Your caregiver guides it  through your urethra and into your bladder. There are two main types of cystoscopy: 6. Flexible cystoscopy (with a flexible cystoscope). 7. Rigid cystoscopy (with a rigid cystoscope). Cystoscopy may be recommended for many conditions, including:  Urinary tract infections.  Blood in your urine (hematuria).  Loss of bladder control (urinary incontinence) or overactive bladder.  Unusual cells found in a urine sample.  Urinary blockage.  Painful urination. Cystoscopy may also be done to remove a sample of your tissue to be checked under a microscope (biopsy). It may also be done to remove or destroy bladder stones. LET YOUR CAREGIVER KNOW ABOUT:  Allergies to food or medicine.  Medicines taken, including vitamins, herbs, eyedrops, over-the-counter medicines, and creams.  Use of steroids (by mouth or creams).  Previous problems with anesthetics or numbing medicines.  History of bleeding problems or blood clots.  Previous surgery.  Other health problems, including diabetes and kidney problems.  Possibility of pregnancy,  if this applies. PROCEDURE The area around the opening to your urethra will be cleaned. A medicine to numb your urethra (local anesthetic) is used. If a tissue sample or stone is removed during the procedure, you may be given a medicine to make you sleep (general anesthetic). Your caregiver will gently insert the tip of the cystoscope into your urethra. The cystoscope will be slowly glided through your urethra and into your bladder. Sterile fluid will flow through the cystoscope and into your bladder. The fluid will expand and stretch your bladder. This gives your caregiver a better view of your bladder walls. The procedure lasts about 15-20 minutes. AFTER THE PROCEDURE If a local anesthetic is used, you will be allowed to go home as soon as you are ready. If a general anesthetic is used, you will be taken to a recovery area until you are stable. You may have temporary  bleeding and burning on urination.   This information is not intended to replace advice given to you by your health care provider. Make sure you discuss any questions you have with your health care provider.   Document Released: 09/05/2000 Document Revised: 09/29/2014 Document Reviewed: 03/01/2012 Elsevier Interactive Patient Education [employer].

## 2016-01-11 ENCOUNTER — Encounter (HOSPITAL_COMMUNITY): Payer: Self-pay

## 2016-01-11 ENCOUNTER — Other Ambulatory Visit: Payer: Self-pay

## 2016-01-11 ENCOUNTER — Encounter (HOSPITAL_COMMUNITY)
Admission: RE | Admit: 2016-01-11 | Discharge: 2016-01-11 | Disposition: A | Discharge status: Home / Self Care | Payer: BLUE CROSS/BLUE SHIELD | Source: Ambulatory Visit | Attending: Urology | Admitting: Urology

## 2016-01-11 DIAGNOSIS — Z01812 Encounter for preprocedural laboratory examination: Secondary | ICD-10-CM | POA: Diagnosis present

## 2016-01-11 DIAGNOSIS — Z01810 Encounter for preprocedural cardiovascular examination: Secondary | ICD-10-CM | POA: Diagnosis not present

## 2016-01-11 HISTORY — DX: Other complications of anesthesia, initial encounter: T88.59XA

## 2016-01-11 HISTORY — DX: Bell's palsy: G51.0

## 2016-01-11 HISTORY — DX: Adverse effect of unspecified anesthetic, initial encounter: T41.45XA

## 2016-01-11 HISTORY — DX: Hypothyroidism, unspecified: E03.9

## 2016-01-11 HISTORY — DX: Personal history of urinary calculi: Z87.442

## 2016-01-11 HISTORY — DX: Nausea with vomiting, unspecified: R11.2

## 2016-01-11 HISTORY — DX: Nausea with vomiting, unspecified: Z98.890

## 2016-01-11 LAB — CBC
HEMATOCRIT: 39.6 % (ref 39.0–52.0)
HEMOGLOBIN: 13.3 g/dL (ref 13.0–17.0)
MCH: 29.8 pg (ref 26.0–34.0)
MCHC: 33.6 g/dL (ref 30.0–36.0)
MCV: 88.6 fL (ref 78.0–100.0)
Platelets: 235 10*3/uL (ref 150–400)
RBC: 4.47 MIL/uL (ref 4.22–5.81)
RDW: 13.2 % (ref 11.5–15.5)
WBC: 6.5 10*3/uL (ref 4.0–10.5)

## 2016-01-11 LAB — BASIC METABOLIC PANEL
Anion gap: 6 (ref 5–15)
BUN: 12 mg/dL (ref 6–20)
CALCIUM: 9.3 mg/dL (ref 8.9–10.3)
CHLORIDE: 100 mmol/L — AB (ref 101–111)
CO2: 29 mmol/L (ref 22–32)
CREATININE: 0.94 mg/dL (ref 0.61–1.24)
GFR calc Af Amer: >60 mL/min (ref >60)
GFR calc non Af Amer: >60 mL/min (ref >60)
GLUCOSE: 333 mg/dL — AB (ref 65–99)
Potassium: 4.1 mmol/L (ref 3.5–5.1)
Sodium: 135 mmol/L (ref 135–145)

## 2016-01-11 NOTE — Pre-Procedure Instructions (Signed)
Patient given information to sign up for my chart at home. 

## 2016-01-14 NOTE — Pre-Procedure Instructions (Signed)
Dr Patsey Berthold aware of glucose and EKG. Will do CBG on arrival 01/16/2016. No other orders.

## 2016-01-16 ENCOUNTER — Encounter (HOSPITAL_COMMUNITY)
Admission: RE | Disposition: A | Discharge status: Home / Self Care | Payer: Self-pay | Source: Ambulatory Visit | Attending: Urology

## 2016-01-16 ENCOUNTER — Ambulatory Visit (HOSPITAL_COMMUNITY)
Admission: RE | Admit: 2016-01-16 | Discharge: 2016-01-16 | Disposition: A | Discharge status: Home / Self Care | Payer: BLUE CROSS/BLUE SHIELD | Source: Ambulatory Visit | Attending: Urology | Admitting: Urology

## 2016-01-16 ENCOUNTER — Ambulatory Visit (HOSPITAL_COMMUNITY): Payer: BLUE CROSS/BLUE SHIELD

## 2016-01-16 ENCOUNTER — Encounter (HOSPITAL_COMMUNITY): Payer: Self-pay | Admitting: *Deleted

## 2016-01-16 ENCOUNTER — Ambulatory Visit (HOSPITAL_COMMUNITY): Payer: BLUE CROSS/BLUE SHIELD | Admitting: Anesthesiology

## 2016-01-16 DIAGNOSIS — N202 Calculus of kidney with calculus of ureter: Secondary | ICD-10-CM | POA: Diagnosis present

## 2016-01-16 DIAGNOSIS — E039 Hypothyroidism, unspecified: Secondary | ICD-10-CM | POA: Insufficient documentation

## 2016-01-16 DIAGNOSIS — I10 Essential (primary) hypertension: Secondary | ICD-10-CM | POA: Insufficient documentation

## 2016-01-16 DIAGNOSIS — N200 Calculus of kidney: Secondary | ICD-10-CM | POA: Insufficient documentation

## 2016-01-16 DIAGNOSIS — Z79899 Other long term (current) drug therapy: Secondary | ICD-10-CM | POA: Insufficient documentation

## 2016-01-16 DIAGNOSIS — Z794 Long term (current) use of insulin: Secondary | ICD-10-CM | POA: Diagnosis not present

## 2016-01-16 DIAGNOSIS — N201 Calculus of ureter: Secondary | ICD-10-CM

## 2016-01-16 DIAGNOSIS — Z87891 Personal history of nicotine dependence: Secondary | ICD-10-CM | POA: Diagnosis not present

## 2016-01-16 HISTORY — PX: CYSTOSCOPY/RETROGRADE/URETEROSCOPY/STONE EXTRACTION WITH BASKET: SHX5317

## 2016-01-16 HISTORY — PX: CYSTOSCOPY WITH STENT PLACEMENT: SHX5790

## 2016-01-16 LAB — GLUCOSE, CAPILLARY
GLUCOSE-CAPILLARY: 253 mg/dL — AB (ref 65–99)
GLUCOSE-CAPILLARY: 203 mg/dL — AB (ref 65–99)
Glucose-Capillary: 162 mg/dL — ABNORMAL HIGH (ref 65–99)

## 2016-01-16 SURGERY — CYSTOSCOPY, WITH CALCULUS REMOVAL USING BASKET
Anesthesia: General | Laterality: Right

## 2016-01-16 MED ORDER — LABETALOL HCL 5 MG/ML IV SOLN
INTRAVENOUS | Status: AC
Start: 2016-01-16 — End: 2016-01-16
  Filled 2016-01-16: qty 4

## 2016-01-16 MED ORDER — ONDANSETRON HCL 4 MG/2ML IJ SOLN: 4.0000 mg | Freq: Once | INTRAMUSCULAR | Status: DC | PRN

## 2016-01-16 MED ORDER — MIDAZOLAM HCL 2 MG/2ML IJ SOLN: 1.0000 mg | INTRAMUSCULAR | Status: DC | PRN

## 2016-01-16 MED ORDER — FENTANYL CITRATE (PF) 100 MCG/2ML IJ SOLN
INTRAMUSCULAR | Status: AC
  Filled 2016-01-16: qty 2

## 2016-01-16 MED ORDER — LIDOCAINE HCL 1 % IJ SOLN
INTRAMUSCULAR | Status: DC | PRN
  Administered 2016-01-16: 25 mg via INTRADERMAL

## 2016-01-16 MED ORDER — PROPOFOL 10 MG/ML IV BOLUS
INTRAVENOUS | Status: DC | PRN
  Administered 2016-01-16: 130 mg via INTRAVENOUS

## 2016-01-16 MED ORDER — HYDROCODONE-ACETAMINOPHEN 5-325 MG PO TABS
ORAL_TABLET | ORAL | Status: AC
  Filled 2016-01-16: qty 1

## 2016-01-16 MED ORDER — ONDANSETRON HCL 4 MG/2ML IJ SOLN
4.0000 mg | Freq: Once | INTRAMUSCULAR | Status: AC
  Administered 2016-01-16: 4 mg via INTRAVENOUS

## 2016-01-16 MED ORDER — FENTANYL CITRATE (PF) 100 MCG/2ML IJ SOLN
25.0000 ug | INTRAMUSCULAR | Status: DC | PRN
  Administered 2016-01-16 (×2): 50 ug via INTRAVENOUS

## 2016-01-16 MED ORDER — FENTANYL CITRATE (PF) 100 MCG/2ML IJ SOLN: 25.0000 ug | INTRAMUSCULAR | Status: DC | PRN

## 2016-01-16 MED ORDER — LACTATED RINGERS IV SOLN
INTRAVENOUS | Status: DC
  Administered 2016-01-16: 13:00:00 via INTRAVENOUS

## 2016-01-16 MED ORDER — MIDAZOLAM HCL 2 MG/2ML IJ SOLN
INTRAMUSCULAR | Status: AC
  Filled 2016-01-16: qty 2

## 2016-01-16 MED ORDER — PROPOFOL 10 MG/ML IV BOLUS
INTRAVENOUS | Status: AC
  Filled 2016-01-16: qty 20

## 2016-01-16 MED ORDER — HYDROCODONE-ACETAMINOPHEN 5-325 MG PO TABS
1.0000 | ORAL_TABLET | Freq: Once | ORAL | Status: AC
  Administered 2016-01-16: 1 via ORAL

## 2016-01-16 MED ORDER — MIDAZOLAM HCL 2 MG/2ML IJ SOLN
1.0000 mg | INTRAMUSCULAR | Status: DC | PRN
  Administered 2016-01-16: 2 mg via INTRAVENOUS
  Filled 2016-01-16: qty 2

## 2016-01-16 MED ORDER — TAMSULOSIN HCL 0.4 MG PO CAPS: 0.4000 mg | ORAL_CAPSULE | Freq: Every day | ORAL | Status: DC

## 2016-01-16 MED ORDER — LIDOCAINE HCL (PF) 1 % IJ SOLN
INTRAMUSCULAR | Status: AC
  Filled 2016-01-16: qty 5

## 2016-01-16 MED ORDER — OXYCODONE-ACETAMINOPHEN 5-325 MG PO TABS: 1.0000 | ORAL_TABLET | Freq: Four times a day (QID) | ORAL | Status: DC | PRN

## 2016-01-16 MED ORDER — CEFAZOLIN SODIUM 1-5 GM-% IV SOLN
1.0000 g | INTRAVENOUS | Status: AC
  Administered 2016-01-16: 1 g via INTRAVENOUS
  Filled 2016-01-16: qty 50

## 2016-01-16 MED ORDER — LABETALOL HCL 5 MG/ML IV SOLN
10.0000 mg | INTRAVENOUS | Status: DC | PRN
  Administered 2016-01-16: 10 mg via INTRAVENOUS

## 2016-01-16 MED ORDER — SODIUM CHLORIDE 0.9% FLUSH
INTRAVENOUS | Status: AC
  Filled 2016-01-16: qty 10

## 2016-01-16 MED ORDER — LACTATED RINGERS IV SOLN: INTRAVENOUS | Status: DC

## 2016-01-16 MED ORDER — SODIUM CHLORIDE 0.9 % IR SOLN
Status: DC | PRN
  Administered 2016-01-16 (×2): 3000 mL

## 2016-01-16 MED ORDER — FENTANYL CITRATE (PF) 100 MCG/2ML IJ SOLN
INTRAMUSCULAR | Status: DC | PRN
  Administered 2016-01-16: 25 ug via INTRAVENOUS

## 2016-01-16 MED ORDER — MIDAZOLAM HCL 5 MG/5ML IJ SOLN
INTRAMUSCULAR | Status: DC | PRN
  Administered 2016-01-16: 2 mg via INTRAVENOUS

## 2016-01-16 MED ORDER — ONDANSETRON HCL 4 MG/2ML IJ SOLN
INTRAMUSCULAR | Status: AC
  Filled 2016-01-16: qty 2

## 2016-01-16 MED ORDER — FENTANYL CITRATE (PF) 100 MCG/2ML IJ SOLN: 25.0000 ug | INTRAMUSCULAR | Status: DC

## 2016-01-16 MED ORDER — FENTANYL CITRATE (PF) 100 MCG/2ML IJ SOLN
25.0000 ug | INTRAMUSCULAR | Status: AC
  Administered 2016-01-16: 25 ug via INTRAVENOUS

## 2016-01-16 MED ORDER — SODIUM CHLORIDE 0.9 % IV SOLN
INTRAVENOUS | Status: DC | PRN
  Administered 2016-01-16: 4 mL

## 2016-01-16 SURGICAL SUPPLY — 25 items
BAG DRAIN URO TABLE W/ADPT NS (DRAPE) ×4 IMPLANT
BAG HAMPER (MISCELLANEOUS) ×4 IMPLANT
CATH INTERMIT  6FR 70CM (CATHETERS) IMPLANT
CLOTH BEACON ORANGE TIMEOUT ST (SAFETY) ×4 IMPLANT
EXTRACTOR STONE NITINOL NGAGE (UROLOGICAL SUPPLIES) IMPLANT
FIBER LASER FLEXIVA 365 (UROLOGICAL SUPPLIES) IMPLANT
GLOVE BIO SURGEON STRL SZ8 (GLOVE) ×4 IMPLANT
GLOVE BIOGEL PI IND STRL 7.0 (GLOVE) ×2 IMPLANT
GLOVE BIOGEL PI INDICATOR 7.0 (GLOVE) ×2
GLOVE ECLIPSE 6.5 STRL STRAW (GLOVE) ×4 IMPLANT
GLOVE EXAM NITRILE MD LF STRL (GLOVE) ×4 IMPLANT
GOWN STRL REUS W/ TWL LRG LVL3 (GOWN DISPOSABLE) ×2 IMPLANT
GOWN STRL REUS W/ TWL XL LVL3 (GOWN DISPOSABLE) ×2 IMPLANT
GOWN STRL REUS W/TWL LRG LVL3 (GOWN DISPOSABLE) ×2
GOWN STRL REUS W/TWL XL LVL3 (GOWN DISPOSABLE) ×2
GUIDEWIRE ANG ZIPWIRE 038X150 (WIRE) ×4 IMPLANT
GUIDEWIRE STR DUAL SENSOR (WIRE) IMPLANT
IV NS IRRIG 3000ML ARTHROMATIC (IV SOLUTION) ×8 IMPLANT
LASER FIBER DISP (UROLOGICAL SUPPLIES) IMPLANT
MANIFOLD NEPTUNE II (INSTRUMENTS) IMPLANT
PACK CYSTO (CUSTOM PROCEDURE TRAY) ×4 IMPLANT
PAD ARMBOARD 7.5X6 YLW CONV (MISCELLANEOUS) ×4 IMPLANT
SHEATH ACCESS URETERAL 38CM (SHEATH) ×4 IMPLANT
STENT URET 6FRX26 CONTOUR (STENTS) ×4 IMPLANT
SYRINGE 10CC LL (SYRINGE) ×4 IMPLANT

## 2016-01-16 NOTE — Brief Op Note (Signed)
01/16/2016  2:02 PM  PATIENT:  Corey Warren  65 y.o. male   PRE-OPERATIVE DIAGNOSIS:  right renal calculi  POST-OPERATIVE DIAGNOSIS:  right renal calculi  PROCEDURE:  Procedure(s): CYSTOSCOPY/BILATERAL RETROGRADE/BILATERAL URETEROSCOPY (Right) CYSTOSCOPY WITH RIGHT STENT PLACEMENT (Right)  SURGEON:  Surgeon(s) and Role:    * Cleon Gustin, MD - Primary  PHYSICIAN ASSISTANT:   ASSISTANTS: none   ANESTHESIA:   general  EBL:  Total I/O In: 600 [I.V.:600] Out: -   BLOOD ADMINISTERED:none  DRAINS: right 6x26 JJ ureteral stent  LOCAL MEDICATIONS USED:  NONE  SPECIMEN:  No Specimen  DISPOSITION OF SPECIMEN:  N/A  COUNTS:  YES  TOURNIQUET:  * No tourniquets in log *  DICTATION: .Note written in EPIC  PLAN OF CARE: Discharge to home after PACU  PATIENT DISPOSITION:  PACU - hemodynamically stable.   Delay start of Pharmacological VTE agent (>24hrs) due to surgical blood loss or risk of bleeding: not applicable

## 2016-01-16 NOTE — Anesthesia Preprocedure Evaluation (Signed)
Anesthesia Evaluation  Patient identified by MRN, date of birth, ID band Patient awake    Reviewed: Allergy & Precautions, NPO status , Patient's Chart, lab work & pertinent test results  History of Anesthesia Complications (+) PONV and history of anesthetic complications  Airway Mallampati: II       Dental  (+) Edentulous Upper, Edentulous Lower   Pulmonary former smoker,    breath sounds clear to auscultation       Cardiovascular hypertension, Pt. on medications  Rhythm:Regular Rate:Normal     Neuro/Psych  Neuromuscular disease    GI/Hepatic   Endo/Other  diabetes, Type 2, Insulin DependentHypothyroidism   Renal/GU Renal disease     Musculoskeletal   Abdominal   Peds  Hematology   Anesthesia Other Findings Took lantus 11u this am.  Reproductive/Obstetrics                             Anesthesia Physical Anesthesia Plan  ASA: III  Anesthesia Plan: General   Post-op Pain Management:    Induction: Intravenous  Airway Management Planned: LMA  Additional Equipment:   Intra-op Plan:   Post-operative Plan: Extubation in OR  Informed Consent: I have reviewed the patients History and Physical, chart, labs and discussed the procedure including the risks, benefits and alternatives for the proposed anesthesia with the patient or authorized representative who has indicated his/her understanding and acceptance.     Plan Discussed with:   Anesthesia Plan Comments:         Anesthesia Quick Evaluation

## 2016-01-16 NOTE — Anesthesia Procedure Notes (Signed)
Procedure Name: LMA Insertion Date/Time: 01/16/2016 1:20 PM Performed by: Charmaine Downs Pre-anesthesia Checklist: Patient identified, Emergency Drugs available, Suction available and Patient being monitored Patient Re-evaluated:Patient Re-evaluated prior to inductionOxygen Delivery Method: Circle system utilized Preoxygenation: Pre-oxygenation with 100% oxygen Intubation Type: IV induction Ventilation: Mask ventilation without difficulty LMA: LMA inserted LMA Size: 4.0 Grade View: Grade II Number of attempts: 1 Placement Confirmation: ETT inserted through vocal cords under direct vision,  positive ETCO2 and breath sounds checked- equal and bilateral Tube secured with: Tape Dental Injury: Teeth and Oropharynx as per pre-operative assessment

## 2016-01-16 NOTE — H&P (Signed)
Urology Admission H&P  Chief Complaint: left flank pain  History of Present Illness: [name] is a 65yo with a hx of DMI who developed severe left flank pain. He was diagnosed with a 15mm left UVJ calculus and multiple large right renal calculi. He has severe LUTS and hematuria.  Past Medical History  Diagnosis Date  . Diabetes mellitus without complication ([place])   . Thyroid disease   . Renal disorder   . Hypertension   . Complication of anesthesia     pt sts he woke up during gallbladder surgery also.  Marland Kitchen PONV (postoperative nausea and vomiting)   . Bell's palsy   . Hypothyroidism   . History of kidney stones    Past Surgical History  Procedure Laterality Date  . Cholecystectomy      pt states, "I woke up on the table when I had my gallbladder".  . Circumcision      Home Medications:  Prescriptions prior to admission  Medication Sig Dispense Refill Last Dose  . atorvastatin (LIPITOR) 10 MG tablet Take 10 mg by mouth daily.   01/15/2016 at 1800  . hydrochlorothiazide (MICROZIDE) 12.5 MG capsule Take 12.5 mg by mouth daily.   01/15/2016 at 0900  . insulin aspart (NOVOLOG) 100 UNIT/ML injection Inject 14-28 Units into the skin 3 (three) times daily with meals. 28 units in the morning and 14 units at lunch 28 units before dinner. Sliding scale: 201-250=2u 251-300=4u 301-350=6u 351-400=8u >400=10u   01/15/2016 at 1400  . insulin glargine (LANTUS) 100 UNIT/ML injection Inject 110,800 Units into the skin daily.    01/16/2016 at 0800  . levothyroxine (SYNTHROID, LEVOTHROID) 25 MCG tablet Take 25-50 mcg by mouth daily before breakfast. Takes one tablet Mon-Fri. Takes 2 tablets on Sat and Sun.   01/15/2016 at 0800  . oxyCODONE-acetaminophen (PERCOCET/ROXICET) 5-325 MG tablet Take 1 tablet by mouth every 6 (six) hours as needed. 20 tablet 0 01/15/2016 at 0800  . tamsulosin (FLOMAX) 0.4 MG CAPS capsule Take 0.4 mg by mouth daily.   01/15/2016 at 0800  . vitamin B-12 (CYANOCOBALAMIN) 1000 MCG  tablet Take 1,000 mcg by mouth daily.   01/15/2016 at 0800   Allergies:  Allergies  Allergen Reactions  . Aspirin   . Codeine Nausea And Vomiting  . Morphine And Related Nausea And Vomiting    Family History  Problem Relation Age of Onset  . Family history unknown: Yes   Social History:  reports that he quit smoking about 5 years ago. His smoking use included Cigarettes. He has a 60 pack-year smoking history. He does not have any smokeless tobacco history on file. He reports that he does not drink alcohol or use illicit drugs.  Review of Systems  Gastrointestinal: Positive for nausea and vomiting.  Genitourinary: Positive for dysuria, urgency, frequency, hematuria and flank pain.  Neurological: Positive for weakness.  All other systems reviewed and are negative.   Physical Exam:  Vital signs in last 24 hours: Temp:  [97.8 F (36.6 C)] 97.8 F (36.6 C) (04/26 1024) Pulse Rate:  [93] 93 (04/26 1024) Resp:  [21] 21 (04/26 1024) BP: (169)/(107) 169/107 mmHg (04/26 1024) SpO2:  [93 %] 93 % (04/26 1024) Weight:  [89.812 kg (198 lb)] 89.812 kg (198 lb) (04/26 1024) Physical Exam  Constitutional: He is oriented to person, place, and time. He appears well-developed and well-nourished.  HENT:  Head: Normocephalic and atraumatic.  Eyes: EOM are normal. Pupils are equal, round, and reactive to light.  Neck: Normal  range of motion. No thyromegaly present.  Cardiovascular: Normal rate and regular rhythm.   Respiratory: Effort normal. No respiratory distress.  GI: Soft. He exhibits no distension and no mass. There is no tenderness. There is no rebound and no guarding.  Musculoskeletal: Normal range of motion.  Neurological: He is alert and oriented to person, place, and time.  Skin: Skin is warm and dry.  Psychiatric: He has a normal mood and affect. His behavior is normal. Judgment and thought content normal.    Laboratory Data:  Results for orders placed or performed during the  hospital encounter of 01/16/16 (from the past 24 hour(s))  Glucose, capillary     Status: Abnormal   Collection Time: 01/16/16 10:32 AM  Result Value Ref Range   Glucose-Capillary 253 (H) 65 - 99 mg/dL   No results found for this or any previous visit (from the past 240 hour(s)). Creatinine:  Recent Labs  01/11/16 1435  CREATININE 0.94   Baseline Creatinine: 0.9  Impression/Assessment:  65yo with left ureteral calculus, right renal calculi  Plan:  The risks/benefits/alternatives to left ureteral stone extraction and right renal stone extraction was explained to the patient and he understands and wishes to proceed with surgery  Nicolette Bang 01/16/2016, 12:39 PM

## 2016-01-16 NOTE — Anesthesia Postprocedure Evaluation (Signed)
Anesthesia Post Note  Patient: Joie Slee  Procedure(s) Performed: Procedure(s) (LRB): CYSTOSCOPY/BILATERAL RETROGRADE/BILATERAL URETEROSCOPY (Right) CYSTOSCOPY WITH RIGHT STENT PLACEMENT (Right)  Patient location during evaluation: PACU Anesthesia Type: General Level of consciousness: awake and alert, oriented and patient cooperative Pain management: pain level controlled Vital Signs Assessment: post-procedure vital signs reviewed and stable Respiratory status: spontaneous breathing, nonlabored ventilation and respiratory function stable Cardiovascular status: blood pressure returned to baseline and stable Postop Assessment: no signs of nausea or vomiting Anesthetic complications: no    Last Vitals:  Filed Vitals:   01/16/16 1430 01/16/16 1445  BP: 175/119 171/100  Pulse: 83 80  Temp:    Resp: 19 20    Last Pain:  Filed Vitals:   01/16/16 1449  PainSc: 4                  Jolleen Seman J

## 2016-01-16 NOTE — Transfer of Care (Signed)
Immediate Anesthesia Transfer of Care Note  Patient: Corey Warren  Procedure(s) Performed: Procedure(s): CYSTOSCOPY/BILATERAL RETROGRADE/BILATERAL URETEROSCOPY (Right) CYSTOSCOPY WITH RIGHT STENT PLACEMENT (Right)  Patient Location: PACU  Anesthesia Type:General  Level of Consciousness: awake, alert  and patient cooperative  Airway & Oxygen Therapy: Patient Spontanous Breathing and Patient connected to face mask oxygen  Post-op Assessment: Report given to RN, Post -op Vital signs reviewed and stable and Patient moving all extremities  Post vital signs: Reviewed and stable  Last Vitals:  Filed Vitals:   01/16/16 1235 01/16/16 1240  BP: 169/101   Pulse:    Temp:    Resp: 21 20    Last Pain:  Filed Vitals:   01/16/16 1241  PainSc: 3       Patients Stated Pain Goal: 5 ([id number])  Complications: No apparent anesthesia complications

## 2016-01-16 NOTE — Discharge Instructions (Signed)
Cystoscopy, Care After  Refer to this sheet in the next few weeks. These instructions provide you with information on caring for yourself after your procedure. Your caregiver may also give you more specific instructions. Your treatment has been planned according to current medical practices, but problems sometimes occur. Call your caregiver if you have any problems or questions after your procedure.  HOME CARE INSTRUCTIONS    Things you can do to ease any discomfort after your procedure include:  · Drinking enough water and fluids to keep your urine clear or pale yellow.  · Taking a warm bath to relieve any burning feelings.  SEEK IMMEDIATE MEDICAL CARE IF:    · You have an increase in blood in your urine.  · You notice blood clots in your urine.  · You have difficulty passing urine.  · You have the chills.  · You have abdominal pain.  · You have a fever or persistent symptoms for more than 2-3 days.  · You have a fever and your symptoms suddenly get worse.  MAKE SURE YOU:    · Understand these instructions.  · Will watch your condition.  · Will get help right away if you are not doing well or get worse.     This information is not intended to replace advice given to you by your health care provider. Make sure you discuss any questions you have with your health care provider.     Document Released: 03/28/2005 Document Revised: 09/29/2014 Document Reviewed: 03/01/2012  Elsevier Interactive Patient Education ©2016 [employer]   

## 2016-01-17 ENCOUNTER — Encounter (HOSPITAL_COMMUNITY): Payer: Self-pay | Admitting: Urology

## 2016-01-18 ENCOUNTER — Other Ambulatory Visit: Payer: Self-pay

## 2016-01-18 DIAGNOSIS — N200 Calculus of kidney: Secondary | ICD-10-CM

## 2016-01-18 NOTE — Op Note (Signed)
.  Preoperative diagnosis: left ureteral calculus, right renal calculus  Postoperative diagnosis: right renal calculus  Procedure: 1 cystoscopy 2. Bilateral retrograde pyelography 3.  Intraoperative fluoroscopy, under one hour, with interpretation 4.  Bilateral ureteroscopic diagnostic ureteroscopy 5.  right 6 x 26 JJ stent placement  Attending: Rosie Fate  Anesthesia: General  Estimated blood loss: None  Drains: right 6 x 26 JJ ureteral stent without tether  Specimens: none  Antibiotics: ancef  Findings: no stone encountered in left ureter. Right renal calculi. No masses/lesions in the bladder. Ureteral orifices in normal anatomic location.  Indications: Patient is a 65 year old male with a history of left ureteral calculus and right renal calculi. After discussing treatment options, they decided proceed with bilateral ureteroscopic stone manipulation.  Procedure her in detail: The patient was brought to the operating room and a brief timeout was done to ensure correct patient, correct procedure, correct site.  General anesthesia was administered patient was placed in dorsal lithotomy position.  Her genitalia was then prepped and draped in usual sterile fashion.  A rigid 62 French cystoscope was passed in the urethra and the bladder.  Bladder was inspected free masses or lesions.   a 6 french ureteral catheter was then instilled into the left ureteral orifice.  a gentle retrograde was obtained and findings noted above.  we then removed the cystoscope and cannulated the left ureteral orifice with a semirigid ureteroscope.  We located no stone in the ureter and we then removed the scope.  We then turned out attention to the right side. a 6 french ureteral catheter was then instilled into the right ureteral orifice.  a gentle retrograde was obtained and findings noted above.  we then removed the cystoscope and cannulated the right ureteral orifice with a semirigid ureteroscope.  We  located no stone in the ureter to the level of the pelvic brim. At this point the ureter was too narrow to advance the scope so we elected to place a stent. we then placed a 6 x 26 double-j ureteral stent over the original zip wire.  We then removed the wire and good coil was noted in the the renal pelvis under fluoroscopy and the bladder under direct vision.  the bladder was then drained and this concluded the procedure which was well tolerated by patient.  Complications: None  Condition: Stable, extubated, transferred to PACU  Plan: Patient is to be discharged home as to follow-up in 2 weeks for right ureteral stone extraction

## 2016-01-23 ENCOUNTER — Ambulatory Visit: Payer: BLUE CROSS/BLUE SHIELD | Admitting: Urology

## 2016-01-23 ENCOUNTER — Encounter (HOSPITAL_COMMUNITY)
Admission: RE | Admit: 2016-01-23 | Discharge: 2016-01-23 | Disposition: A | Discharge status: Home / Self Care | Payer: BLUE CROSS/BLUE SHIELD | Source: Ambulatory Visit | Attending: Urology | Admitting: Urology

## 2016-01-23 ENCOUNTER — Telehealth: Payer: Self-pay | Admitting: Urology

## 2016-01-23 ENCOUNTER — Encounter (HOSPITAL_COMMUNITY): Payer: Self-pay

## 2016-01-23 NOTE — Telephone Encounter (Signed)
Pt advised of pre op date/time and sx date. Sx: 01/30/16 with Dr Alyson Ingles At [facility]. Pre op: 01/23/16--Phone Post op: 02/06/16 @ 1:45pm.  No authorization is required as long as done outpatient per April D with [facility] [place].

## 2016-01-30 ENCOUNTER — Ambulatory Visit (HOSPITAL_COMMUNITY): Payer: BLUE CROSS/BLUE SHIELD | Admitting: Anesthesiology

## 2016-01-30 ENCOUNTER — Ambulatory Visit (HOSPITAL_COMMUNITY)
Admission: RE | Admit: 2016-01-30 | Discharge: 2016-01-30 | Disposition: A | Discharge status: Home / Self Care | Payer: BLUE CROSS/BLUE SHIELD | Source: Ambulatory Visit | Attending: Urology | Admitting: Urology

## 2016-01-30 ENCOUNTER — Encounter (HOSPITAL_COMMUNITY): Payer: Self-pay | Admitting: Anesthesiology

## 2016-01-30 ENCOUNTER — Encounter (HOSPITAL_COMMUNITY)
Admission: RE | Disposition: A | Discharge status: Home / Self Care | Payer: Self-pay | Source: Ambulatory Visit | Attending: Urology

## 2016-01-30 ENCOUNTER — Ambulatory Visit (HOSPITAL_COMMUNITY): Payer: BLUE CROSS/BLUE SHIELD

## 2016-01-30 DIAGNOSIS — E039 Hypothyroidism, unspecified: Secondary | ICD-10-CM | POA: Insufficient documentation

## 2016-01-30 DIAGNOSIS — Z79899 Other long term (current) drug therapy: Secondary | ICD-10-CM | POA: Insufficient documentation

## 2016-01-30 DIAGNOSIS — N200 Calculus of kidney: Secondary | ICD-10-CM | POA: Diagnosis not present

## 2016-01-30 DIAGNOSIS — Z794 Long term (current) use of insulin: Secondary | ICD-10-CM | POA: Diagnosis not present

## 2016-01-30 DIAGNOSIS — I10 Essential (primary) hypertension: Secondary | ICD-10-CM | POA: Insufficient documentation

## 2016-01-30 DIAGNOSIS — Z87891 Personal history of nicotine dependence: Secondary | ICD-10-CM | POA: Diagnosis not present

## 2016-01-30 DIAGNOSIS — E119 Type 2 diabetes mellitus without complications: Secondary | ICD-10-CM | POA: Diagnosis not present

## 2016-01-30 HISTORY — PX: CYSTOSCOPY W/ URETERAL STENT PLACEMENT: SHX1429

## 2016-01-30 HISTORY — PX: HOLMIUM LASER APPLICATION: SHX5852

## 2016-01-30 HISTORY — PX: CYSTOSCOPY/RETROGRADE/URETEROSCOPY/STONE EXTRACTION WITH BASKET: SHX5317

## 2016-01-30 LAB — GLUCOSE, CAPILLARY
Glucose-Capillary: 172 mg/dL — ABNORMAL HIGH (ref 65–99)
Glucose-Capillary: 108 mg/dL — ABNORMAL HIGH (ref 65–99)

## 2016-01-30 SURGERY — CYSTOSCOPY, WITH CALCULUS REMOVAL USING BASKET
Anesthesia: General | Laterality: Right

## 2016-01-30 MED ORDER — FENTANYL CITRATE (PF) 100 MCG/2ML IJ SOLN: 25.0000 ug | INTRAMUSCULAR | Status: DC | PRN

## 2016-01-30 MED ORDER — FENTANYL CITRATE (PF) 100 MCG/2ML IJ SOLN
25.0000 ug | INTRAMUSCULAR | Status: AC
  Administered 2016-01-30 (×2): 25 ug via INTRAVENOUS

## 2016-01-30 MED ORDER — FENTANYL CITRATE (PF) 100 MCG/2ML IJ SOLN
INTRAMUSCULAR | Status: DC | PRN
  Administered 2016-01-30: 25 ug via INTRAVENOUS
  Administered 2016-01-30: 50 ug via INTRAVENOUS
  Administered 2016-01-30: 25 ug via INTRAVENOUS

## 2016-01-30 MED ORDER — ONDANSETRON HCL 4 MG/2ML IJ SOLN: 4.0000 mg | Freq: Once | INTRAMUSCULAR | Status: DC | PRN

## 2016-01-30 MED ORDER — PROPOFOL 10 MG/ML IV BOLUS
INTRAVENOUS | Status: DC | PRN
  Administered 2016-01-30: 130 mg via INTRAVENOUS

## 2016-01-30 MED ORDER — MIDAZOLAM HCL 2 MG/2ML IJ SOLN
INTRAMUSCULAR | Status: AC
  Filled 2016-01-30: qty 2

## 2016-01-30 MED ORDER — MIDAZOLAM HCL 2 MG/2ML IJ SOLN
1.0000 mg | INTRAMUSCULAR | Status: DC | PRN
Start: 2016-01-30 — End: 2016-01-31
  Administered 2016-01-30 (×2): 2 mg via INTRAVENOUS
  Filled 2016-01-30: qty 2

## 2016-01-30 MED ORDER — DIATRIZOATE MEGLUMINE 30 % UR SOLN
URETHRAL | Status: AC
  Filled 2016-01-30: qty 300

## 2016-01-30 MED ORDER — SUCCINYLCHOLINE CHLORIDE 20 MG/ML IJ SOLN
INTRAMUSCULAR | Status: AC
  Filled 2016-01-30: qty 1

## 2016-01-30 MED ORDER — OXYCODONE-ACETAMINOPHEN 5-325 MG PO TABS: 1.0000 | ORAL_TABLET | Freq: Four times a day (QID) | ORAL | Status: DC | PRN

## 2016-01-30 MED ORDER — FENTANYL CITRATE (PF) 100 MCG/2ML IJ SOLN
INTRAMUSCULAR | Status: AC
Start: 2016-01-30 — End: 2016-01-30
  Filled 2016-01-30: qty 2

## 2016-01-30 MED ORDER — STERILE WATER FOR IRRIGATION IR SOLN
Status: DC | PRN
  Administered 2016-01-30: 3000 mL

## 2016-01-30 MED ORDER — DIATRIZOATE MEGLUMINE 30 % UR SOLN
URETHRAL | Status: DC | PRN
  Administered 2016-01-30: 5.5 mL via URETHRAL

## 2016-01-30 MED ORDER — SULFAMETHOXAZOLE-TRIMETHOPRIM 800-160 MG PO TABS: 1.0000 | ORAL_TABLET | Freq: Two times a day (BID) | ORAL | Status: DC

## 2016-01-30 MED ORDER — FENTANYL CITRATE (PF) 100 MCG/2ML IJ SOLN
INTRAMUSCULAR | Status: AC
  Filled 2016-01-30: qty 2

## 2016-01-30 MED ORDER — LIDOCAINE HCL 1 % IJ SOLN
INTRAMUSCULAR | Status: DC | PRN
  Administered 2016-01-30: 40 mg via INTRADERMAL

## 2016-01-30 MED ORDER — SODIUM CHLORIDE 0.9 % IR SOLN
Status: DC | PRN
  Administered 2016-01-30: 3000 mL

## 2016-01-30 MED ORDER — DEXTROSE 5 % IV SOLN
2.0000 g | Freq: Once | INTRAVENOUS | Status: AC
  Administered 2016-01-30: 1 g via INTRAVENOUS
  Filled 2016-01-30: qty 2

## 2016-01-30 MED ORDER — TAMSULOSIN HCL 0.4 MG PO CAPS: 0.4000 mg | ORAL_CAPSULE | Freq: Every day | ORAL | Status: DC

## 2016-01-30 MED ORDER — LACTATED RINGERS IV SOLN
INTRAVENOUS | Status: DC
  Administered 2016-01-30: 16:00:00 via INTRAVENOUS
  Administered 2016-01-30: 1000 mL via INTRAVENOUS

## 2016-01-30 SURGICAL SUPPLY — 23 items
BAG DRAIN URO TABLE W/ADPT NS (DRAPE) ×3 IMPLANT
BAG HAMPER (MISCELLANEOUS) ×3 IMPLANT
CATH INTERMIT  6FR 70CM (CATHETERS) ×3 IMPLANT
CLOTH BEACON ORANGE TIMEOUT ST (SAFETY) ×3 IMPLANT
EXTRACTOR STONE NITINOL NGAGE (UROLOGICAL SUPPLIES) ×3 IMPLANT
FIBER LASER FLEXIVA 200 (UROLOGICAL SUPPLIES) ×3 IMPLANT
FIBER LASER FLEXIVA 365 (UROLOGICAL SUPPLIES) IMPLANT
GLOVE BIO SURGEON STRL SZ8 (GLOVE) ×3 IMPLANT
GOWN STRL REUS W/ TWL LRG LVL3 (GOWN DISPOSABLE) ×1 IMPLANT
GOWN STRL REUS W/ TWL XL LVL3 (GOWN DISPOSABLE) ×1 IMPLANT
GOWN STRL REUS W/TWL LRG LVL3 (GOWN DISPOSABLE) ×2
GOWN STRL REUS W/TWL XL LVL3 (GOWN DISPOSABLE) ×2
GUIDEWIRE ANG ZIPWIRE 038X150 (WIRE) ×3 IMPLANT
GUIDEWIRE STR DUAL SENSOR (WIRE) IMPLANT
IV NS IRRIG 3000ML ARTHROMATIC (IV SOLUTION) ×3 IMPLANT
LASER FIBER DISP (UROLOGICAL SUPPLIES) IMPLANT
MANIFOLD NEPTUNE II (INSTRUMENTS) ×3 IMPLANT
PACK CYSTO (CUSTOM PROCEDURE TRAY) ×3 IMPLANT
PAD ARMBOARD 7.5X6 YLW CONV (MISCELLANEOUS) ×3 IMPLANT
SHEATH ACCESS URETERAL 38CM (SHEATH) ×3 IMPLANT
STENT URET 6FRX26 CONTOUR (STENTS) ×3 IMPLANT
SYRINGE 10CC LL (SYRINGE) ×3 IMPLANT
WATER STERILE IRR 3000ML UROMA (IV SOLUTION) ×3 IMPLANT

## 2016-01-30 NOTE — Anesthesia Procedure Notes (Signed)
Procedure Name: LMA Insertion Date/Time: 01/30/2016 2:49 PM Performed by: Tressie Stalker E Pre-anesthesia Checklist: Patient identified, Patient being monitored, Emergency Drugs available, Timeout performed and Suction available Patient Re-evaluated:Patient Re-evaluated prior to inductionOxygen Delivery Method: Circle System Utilized Preoxygenation: Pre-oxygenation with 100% oxygen Intubation Type: IV induction Ventilation: Mask ventilation without difficulty LMA: LMA inserted LMA Size: 4.0 Number of attempts: 1 Placement Confirmation: positive ETCO2 and breath sounds checked- equal and bilateral Tube secured with: Tape

## 2016-01-30 NOTE — H&P (View-Only) (Signed)
Urology Admission H&P  Chief Complaint: left flank pain  History of Present Illness: [name] is a 65yo with a hx of DMI who developed severe left flank pain. He was diagnosed with a 15mm left UVJ calculus and multiple large right renal calculi. He has severe LUTS and hematuria.  Past Medical History  Diagnosis Date  . Diabetes mellitus without complication ([place])   . Thyroid disease   . Renal disorder   . Hypertension   . Complication of anesthesia     pt sts he woke up during gallbladder surgery also.  Marland Kitchen PONV (postoperative nausea and vomiting)   . Bell's palsy   . Hypothyroidism   . History of kidney stones    Past Surgical History  Procedure Laterality Date  . Cholecystectomy      pt states, "I woke up on the table when I had my gallbladder".  . Circumcision      Home Medications:  Prescriptions prior to admission  Medication Sig Dispense Refill Last Dose  . atorvastatin (LIPITOR) 10 MG tablet Take 10 mg by mouth daily.   01/15/2016 at 1800  . hydrochlorothiazide (MICROZIDE) 12.5 MG capsule Take 12.5 mg by mouth daily.   01/15/2016 at 0900  . insulin aspart (NOVOLOG) 100 UNIT/ML injection Inject 14-28 Units into the skin 3 (three) times daily with meals. 28 units in the morning and 14 units at lunch 28 units before dinner. Sliding scale: 201-250=2u 251-300=4u 301-350=6u 351-400=8u >400=10u   01/15/2016 at 1400  . insulin glargine (LANTUS) 100 UNIT/ML injection Inject 110,800 Units into the skin daily.    01/16/2016 at 0800  . levothyroxine (SYNTHROID, LEVOTHROID) 25 MCG tablet Take 25-50 mcg by mouth daily before breakfast. Takes one tablet Mon-Fri. Takes 2 tablets on Sat and Sun.   01/15/2016 at 0800  . oxyCODONE-acetaminophen (PERCOCET/ROXICET) 5-325 MG tablet Take 1 tablet by mouth every 6 (six) hours as needed. 20 tablet 0 01/15/2016 at 0800  . tamsulosin (FLOMAX) 0.4 MG CAPS capsule Take 0.4 mg by mouth daily.   01/15/2016 at 0800  . vitamin B-12 (CYANOCOBALAMIN) 1000 MCG  tablet Take 1,000 mcg by mouth daily.   01/15/2016 at 0800   Allergies:  Allergies  Allergen Reactions  . Aspirin   . Codeine Nausea And Vomiting  . Morphine And Related Nausea And Vomiting    Family History  Problem Relation Age of Onset  . Family history unknown: Yes   Social History:  reports that he quit smoking about 5 years ago. His smoking use included Cigarettes. He has a 60 pack-year smoking history. He does not have any smokeless tobacco history on file. He reports that he does not drink alcohol or use illicit drugs.  Review of Systems  Gastrointestinal: Positive for nausea and vomiting.  Genitourinary: Positive for dysuria, urgency, frequency, hematuria and flank pain.  Neurological: Positive for weakness.  All other systems reviewed and are negative.   Physical Exam:  Vital signs in last 24 hours: Temp:  [97.8 F (36.6 C)] 97.8 F (36.6 C) (04/26 1024) Pulse Rate:  [93] 93 (04/26 1024) Resp:  [21] 21 (04/26 1024) BP: (169)/(107) 169/107 mmHg (04/26 1024) SpO2:  [93 %] 93 % (04/26 1024) Weight:  [89.812 kg (198 lb)] 89.812 kg (198 lb) (04/26 1024) Physical Exam  Constitutional: He is oriented to person, place, and time. He appears well-developed and well-nourished.  HENT:  Head: Normocephalic and atraumatic.  Eyes: EOM are normal. Pupils are equal, round, and reactive to light.  Neck: Normal  range of motion. No thyromegaly present.  Cardiovascular: Normal rate and regular rhythm.   Respiratory: Effort normal. No respiratory distress.  GI: Soft. He exhibits no distension and no mass. There is no tenderness. There is no rebound and no guarding.  Musculoskeletal: Normal range of motion.  Neurological: He is alert and oriented to person, place, and time.  Skin: Skin is warm and dry.  Psychiatric: He has a normal mood and affect. His behavior is normal. Judgment and thought content normal.    Laboratory Data:  Results for orders placed or performed during the  hospital encounter of 01/16/16 (from the past 24 hour(s))  Glucose, capillary     Status: Abnormal   Collection Time: 01/16/16 10:32 AM  Result Value Ref Range   Glucose-Capillary 253 (H) 65 - 99 mg/dL   No results found for this or any previous visit (from the past 240 hour(s)). Creatinine:  Recent Labs  01/11/16 1435  CREATININE 0.94   Baseline Creatinine: 0.9  Impression/Assessment:  65yo with left ureteral calculus, right renal calculi  Plan:  The risks/benefits/alternatives to left ureteral stone extraction and right renal stone extraction was explained to the patient and he understands and wishes to proceed with surgery  Nicolette Bang 01/16/2016, 12:39 PM

## 2016-01-30 NOTE — Interval H&P Note (Signed)
History and Physical Interval Note:  01/30/2016 1:33 PM  Corey Warren  has presented today for surgery, with the diagnosis of Right Renal Calculi  The various methods of treatment have been discussed with the patient and family. After consideration of risks, benefits and other options for treatment, the patient has consented to  Procedure(s): cystoscopy, right retrrograde, right stone extraction with laser, right stent exchange (Right) HOLMIUM LASER APPLICATION (Right) CYSTOSCOPY WITH STENT REPLACEMENT (Right) as a surgical intervention .  The patient's history has been reviewed, patient examined, no change in status, stable for surgery.  I have reviewed the patient's chart and labs.  Questions were answered to the patient's satisfaction.     Nicolette Bang

## 2016-01-30 NOTE — Anesthesia Preprocedure Evaluation (Signed)
Anesthesia Evaluation  Patient identified by MRN, date of birth, ID band Patient awake    Reviewed: Allergy & Precautions, NPO status , Patient's Chart, lab work & pertinent test results  History of Anesthesia Complications (+) PONV and history of anesthetic complications  Airway Mallampati: II       Dental  (+) Edentulous Upper, Edentulous Lower   Pulmonary pneumonia, resolved, former smoker,    breath sounds clear to auscultation       Cardiovascular hypertension, Pt. on medications  Rhythm:Regular Rate:Normal     Neuro/Psych  Neuromuscular disease    GI/Hepatic negative GI ROS,   Endo/Other  diabetes, Type 2, Insulin DependentHypothyroidism   Renal/GU Renal disease     Musculoskeletal   Abdominal   Peds  Hematology   Anesthesia Other Findings   Reproductive/Obstetrics                             Anesthesia Physical Anesthesia Plan  ASA: III  Anesthesia Plan: General   Post-op Pain Management:    Induction: Intravenous  Airway Management Planned: LMA  Additional Equipment:   Intra-op Plan:   Post-operative Plan: Extubation in OR  Informed Consent: I have reviewed the patients History and Physical, chart, labs and discussed the procedure including the risks, benefits and alternatives for the proposed anesthesia with the patient or authorized representative who has indicated his/her understanding and acceptance.     Plan Discussed with:   Anesthesia Plan Comments: (Lantus 12u this AM. CBG=172.)        Anesthesia Quick Evaluation

## 2016-01-30 NOTE — Discharge Instructions (Addendum)
Excuse from Work, [employer], or Physical Activity ________Mr Corey Sleight Dix__________________________________________ needs to be excused from: __X___ Work _____ [employer] _____ Physical activity beginning now and through the following date: ___4/28/2017-5/16/2017_________________. _____ He or she may return to work or school but should still avoid the following physical activity or activities from now until ____________________. Activity restrictions include: _____ Lifting more than _______ lb _____ Sitting longer than __________ minutes at a time _____ Standing longer than ________ minutes at a time _____ He or she may return to full physical activity as of ____________________. Health Care Provider Name (printed): ________________________________________  Health Care Provider (signature): ___________________________________________ Date: ________________   This information is not intended to replace advice given to you by your health care provider. Make sure you discuss any questions you have with your health care provider.   Document Released: 03/04/2001 Document Revised: 09/29/2014 Document Reviewed: 04/10/2014 Elsevier Interactive Patient Education 2016 [place] Anesthesia, Adult, Care After Refer to this sheet in the next few weeks. These instructions provide you with information on caring for yourself after your procedure. Your health care provider may also give you more specific instructions. Your treatment has been planned according to current medical practices, but problems sometimes occur. Call your health care provider if you have any problems or questions after your procedure. WHAT TO EXPECT AFTER THE PROCEDURE After the procedure, it is typical to experience:  Sleepiness.  Nausea and vomiting. HOME CARE INSTRUCTIONS  For the first 24 hours after general anesthesia:  Have a responsible person with you.  Do not drive a car. If you are alone, do not take public  transportation.  Do not drink alcohol.  Do not take medicine that has not been prescribed by your health care provider.  Do not sign important papers or make important decisions.  You may resume a normal diet and activities as directed by your health care provider.  Change bandages (dressings) as directed.  If you have questions or problems that seem related to general anesthesia, call the hospital and ask for the anesthetist or anesthesiologist on call. SEEK MEDICAL CARE IF:  You have nausea and vomiting that continue the day after anesthesia.  You develop a rash. SEEK IMMEDIATE MEDICAL CARE IF:   You have difficulty breathing.  You have chest pain.  You have any allergic problems.   This information is not intended to replace advice given to you by your health care provider. Make sure you discuss any questions you have with your health care provider.   Document Released: 12/15/2000 Document Revised: 09/29/2014 Document Reviewed: 01/07/2012 Elsevier Interactive Patient Education 2016 [place] is a treatment that can sometimes help eliminate kidney stones and pain that they cause. A form of lithotripsy, also known as extracorporeal shock wave lithotripsy, is a nonsurgical procedure that helps your body rid itself of the kidney stone when it is too big to pass on its own. Extracorporeal shock wave lithotripsy is a method of crushing a kidney stone with shock waves. These shock waves pass through your body and are focused on your stone. They cause the kidney stones to crumble while still in the urinary tract. It is then easier for the smaller pieces of stone to pass in the urine. Lithotripsy usually takes about an hour. It is done in a hospital, a lithotripsy center, or a mobile unit. It usually does not require an overnight stay. Your health care provider will instruct you on preparation for the procedure. Your health care provider will  tell you what to  expect afterward. LET [facility] CARE PROVIDER KNOW ABOUT:  Any allergies you have.  All medicines you are taking, including vitamins, herbs, eye drops, creams, and over-the-counter medicines.  Previous problems you or members of your family have had with the use of anesthetics.  Any blood disorders you have.  Previous surgeries you have had.  Medical conditions you have. RISKS AND COMPLICATIONS Generally, lithotripsy for kidney stones is a safe procedure. However, as with any procedure, complications can occur. Possible complications include:  Infection.  Bleeding of the kidney.  Bruising of the kidney or skin.  Obstruction of the ureter.  Failure of the stone to fragment. BEFORE THE PROCEDURE  Do not eat or drink for 6-8 hours prior to the procedure. You may, however, take the medications with a sip of water that your physician instructs you to take  Do not take aspirin or aspirin-containing products for 7 days prior to your procedure  Do not take nonsteroidal anti-inflammatory products for 7 days prior to your procedure PROCEDURE A stent (flexible tube with holes) may be placed in your ureter. The ureter is the tube that transports the urine from the kidneys to the bladder. Your health care provider may place a stent before the procedure. This will help keep urine flowing from the kidney if the fragments of the stone block the ureter. You may have an IV tube placed in one of your veins to give you fluids and medicines. These medicines may help you relax or make you sleep. During the procedure, you will lie comfortably on a fluid-filled cushion or in a warm-water bath. After an X-ray or ultrasound exam to locate your stone, shock waves are aimed at the stone. If you are awake, you may feel a tapping sensation as the shock waves pass through your body. If large stone particles remain after treatment, a second procedure may be necessary at a later date. For comfort during the  test:  Relax as much as possible.  Try to remain still as much as possible.  Try to follow instructions to speed up the test.  Let your health care provider know if you are uncomfortable, anxious, or in pain. AFTER THE PROCEDURE  After surgery, you will be taken to the recovery area. A nurse will watch and check your progress. Once you're awake, stable, and taking fluids well, you will be allowed to go home as long as there are no problems. You will also be allowed to pass your urine before discharge.You may be given antibiotics to help prevent infection. You may also be prescribed pain medicine if needed. In a week or two, your health care provider may remove your stent, if you have one. You may first have an X-ray exam to check on how successful the fragmentation of your stone has been and how much of the stone has passed. Your health care provider will check to see whether or not stone particles remain. SEEK IMMEDIATE MEDICAL CARE IF:  You develop a fever or shaking chills.  Your pain is not relieved by medicine.  You feel sick to your stomach (nauseated) and you vomit.  You develop heavy bleeding.  You have difficulty urinating.  You start to pass your stent from your penis.   This information is not intended to replace advice given to you by your health care provider. Make sure you discuss any questions you have with your health care provider.   Document Released: 09/05/2000 Document Revised: 09/29/2014 Document  Reviewed: 03/24/2013 Elsevier Interactive Patient Education 2016 [place]. Cystoscopy, Care After Refer to this sheet in the next few weeks. These instructions provide you with information on caring for yourself after your procedure. Your caregiver may also give you more specific instructions. Your treatment has been planned according to current medical practices, but problems sometimes occur. Call your caregiver if you have any problems or questions after your  procedure. HOME CARE INSTRUCTIONS  Things you can do to ease any discomfort after your procedure include:  Drinking enough water and fluids to keep your urine clear or pale yellow.  Taking a warm bath to relieve any burning feelings. SEEK IMMEDIATE MEDICAL CARE IF:   You have an increase in blood in your urine.  You notice blood clots in your urine.  You have difficulty passing urine.  You have the chills.  You have abdominal pain.  You have a fever or persistent symptoms for more than 2-3 days.  You have a fever and your symptoms suddenly get worse. MAKE SURE YOU:   Understand these instructions.  Will watch your condition.  Will get help right away if you are not doing well or get worse.   This information is not intended to replace advice given to you by your health care provider. Make sure you discuss any questions you have with your health care provider.   Document Released: 03/28/2005 Document Revised: 09/29/2014 Document Reviewed: 03/01/2012 Elsevier Interactive Patient Education [employer].

## 2016-01-30 NOTE — Brief Op Note (Signed)
01/30/2016  3:49 PM  PATIENT:  Corey Warren  65 y.o. male  PRE-OPERATIVE DIAGNOSIS:  Right Renal Calculi  POST-OPERATIVE DIAGNOSIS:  Right Renal Calculi  PROCEDURE:  Procedure(s): cystoscopy, right retrrograde, right stone extraction with basket and laser, right stent exchange (Right) HOLMIUM LASER APPLICATION (Right) CYSTOSCOPY WITH STENT REPLACEMENT (Right)  SURGEON:  Surgeon(s) and Role:    * Cleon Gustin, MD - Primary  PHYSICIAN ASSISTANT:   ASSISTANTS: none   ANESTHESIA:   general  EBL:  Total I/O In: 700 [I.V.:700] Out: -   BLOOD ADMINISTERED:none  DRAINS: right 6x26 JJ stent with tether  LOCAL MEDICATIONS USED:  NONE  SPECIMEN:  Source of Specimen:  right renal calculus  DISPOSITION OF SPECIMEN:  N/A  COUNTS:  YES  TOURNIQUET:  * No tourniquets in log *  DICTATION: .Note written in EPIC  PLAN OF CARE: Discharge to home after PACU  PATIENT DISPOSITION:  PACU - hemodynamically stable.   Delay start of Pharmacological VTE agent (>24hrs) due to surgical blood loss or risk of bleeding: not applicable

## 2016-01-30 NOTE — Transfer of Care (Signed)
Immediate Anesthesia Transfer of Care Note  Patient: Corey Warren  Procedure(s) Performed: Procedure(s): cystoscopy, right retrrograde, right stone extraction with basket and laser, right stent exchange (Right) HOLMIUM LASER APPLICATION (Right) CYSTOSCOPY WITH STENT REPLACEMENT (Right)  Patient Location: PACU  Anesthesia Type:General  Level of Consciousness: awake, alert  and oriented  Airway & Oxygen Therapy: Patient Spontanous Breathing and Patient connected to face mask oxygen  Post-op Assessment: Report given to RN  Post vital signs: Reviewed and stable  Last Vitals:  Filed Vitals:   01/30/16 1430 01/30/16 1435  BP: 140/87 145/88  Temp:    Resp: 16 32    Last Pain:  Filed Vitals:   01/30/16 1437  PainSc: 7          Complications: No apparent anesthesia complications

## 2016-01-30 NOTE — Anesthesia Postprocedure Evaluation (Signed)
Anesthesia Post Note  Patient: Corey Warren  Procedure(s) Performed: Procedure(s) (LRB): cystoscopy, right retrrograde, right stone extraction with basket and laser, right stent exchange (Right) HOLMIUM LASER APPLICATION (Right) CYSTOSCOPY WITH STENT REPLACEMENT (Right)  Patient location during evaluation: PACU Anesthesia Type: General Level of consciousness: awake and alert and oriented Pain management: pain level controlled Vital Signs Assessment: post-procedure vital signs reviewed and stable Respiratory status: spontaneous breathing Cardiovascular status: blood pressure returned to baseline Postop Assessment: no signs of nausea or vomiting Anesthetic complications: no    Last Vitals:  Filed Vitals:   01/30/16 1435 01/30/16 1605  BP: 145/88   Pulse:  89  Temp:  36.5 C  Resp: 32 12    Last Pain:  Filed Vitals:   01/30/16 1633  PainSc: 5                  Landra Howze

## 2016-01-31 ENCOUNTER — Encounter (HOSPITAL_COMMUNITY): Payer: Self-pay | Admitting: Urology

## 2016-01-31 NOTE — Op Note (Signed)
Preoperative diagnosis: Right renal stone  Postoperative diagnosis: Same  Procedure: 1 cystoscopy 2 right retrograde pyelography 3.  Intraoperative fluoroscopy, under one hour, with interpretation 4.  Right ureteroscopic stone manipulation with laser lithotripsy 5.  Right 6 x 26 JJ exchange  Attending: Rosie Fate  Anesthesia: General  Estimated blood loss: None  Drains: Right 6 x 26 JJ ureteral stent with tether  Specimens: stone for analysis  Antibiotics: rocephin  Findings: Right upper and mid pole calculus removed. Lower pole calculus unable to be removed due to acute angle of lower pole No hydronephrosis. No masses/lesions in the bladder. Ureteral orifices in normal anatomic location.  Indications: Patient is a 65 year old male with a history of right renal stone and who has persistent right flank pain.  After discussing treatment options, he decided proceed with right ureteroscopic stone manipulation.  Procedure her in detail: The patient was brought to the operating room and a brief timeout was done to ensure correct patient, correct procedure, correct site.  General anesthesia was administered patient was placed in dorsal lithotomy position.  Her genitalia was then prepped and draped in usual sterile fashion.  A rigid 17 French cystoscope was passed in the urethra and the bladder.  Bladder was inspected free masses or lesions.  the right ureteral orifices were in the normal orthotopic locations.  Using a grasper the right ureteral stent was brought to the urethral meatus. A zipwire was advanced through the stent and up to the renal pelvis. The stent was then removed. a 6 french ureteral catheter was then instilled into the right ureter orifice.  a gentle retrograde was obtained and findings noted above.  we then removed the cystoscope and cannulated the right ureteral orifice with a semirigid ureteroscope.  No stone was found in the ureter. Once we reached the UPJ a sensor  wire was advanced in to the renal pelvis. We then removed the ureteroscope and advanced a 12/14 x 38cm access sheath up to the renal pelvis. We then used the flexible ureteroscope to perform nephroscopy. We encountered the stone in the upper and mid poles.  using using a 200 nm laser fiber and fragmented the stone into smaller pieces.  the pieces were then removed with a Ngage basket.  We were unable to remove the lower pole stone. once all stone fragments were removed we then placed a 6 x 26 double-j ureteral stent over the original zip wire.   We then removed the wire and good coil was noted in the the renal pelvis under fluoroscopy and the bladder under direct vision.   the bladder was then drained and this concluded the procedure which was well tolerated by patient.  Complications: None  Condition: Stable, extubated, transferred to PACU  Plan: Patient is to be discharged home as to follow-up in 2 weeks. He is to remove his stent by pulling the tether in 3 days.

## 2016-02-06 ENCOUNTER — Ambulatory Visit (INDEPENDENT_AMBULATORY_CARE_PROVIDER_SITE_OTHER): Payer: BLUE CROSS/BLUE SHIELD | Admitting: Urology

## 2016-02-06 DIAGNOSIS — N200 Calculus of kidney: Secondary | ICD-10-CM | POA: Diagnosis not present

## 2016-02-06 DIAGNOSIS — S39012A Strain of muscle, fascia and tendon of lower back, initial encounter: Secondary | ICD-10-CM | POA: Diagnosis not present

## 2016-02-06 LAB — STONE ANALYSIS
CA PHOS CRY STONE QL IR: 10 %
Ca Oxalate,Dihydrate: 5 %
Ca Oxalate,Monohydr.: 85 %
STONE WEIGHT KSTONE: 101 mg

## 2016-02-27 ENCOUNTER — Ambulatory Visit (INDEPENDENT_AMBULATORY_CARE_PROVIDER_SITE_OTHER): Payer: BLUE CROSS/BLUE SHIELD | Admitting: Urology

## 2016-02-27 ENCOUNTER — Other Ambulatory Visit (HOSPITAL_COMMUNITY)
Admission: RE | Admit: 2016-02-27 | Discharge: 2016-02-27 | Disposition: A | Discharge status: Home / Self Care | Payer: BLUE CROSS/BLUE SHIELD | Source: Other Acute Inpatient Hospital | Attending: Urology | Admitting: Urology

## 2016-02-27 DIAGNOSIS — N200 Calculus of kidney: Secondary | ICD-10-CM | POA: Insufficient documentation

## 2016-02-27 DIAGNOSIS — M549 Dorsalgia, unspecified: Secondary | ICD-10-CM

## 2016-02-27 LAB — URINALYSIS, ROUTINE W REFLEX MICROSCOPIC
BILIRUBIN URINE: NEGATIVE
HGB URINE DIPSTICK: NEGATIVE
Ketones, ur: NEGATIVE mg/dL
Leukocytes, UA: NEGATIVE
Nitrite: NEGATIVE
PH: 6 (ref 5.0–8.0)
PROTEIN: NEGATIVE mg/dL
SPECIFIC GRAVITY, URINE: 1.02 (ref 1.005–1.030)

## 2016-02-27 LAB — URINE MICROSCOPIC-ADD ON
Bacteria, UA: NONE SEEN
RBC / HPF: NONE SEEN RBC/hpf (ref 0–5)
SQUAMOUS EPITHELIAL / LPF: NONE SEEN

## 2016-03-26 ENCOUNTER — Ambulatory Visit (INDEPENDENT_AMBULATORY_CARE_PROVIDER_SITE_OTHER): Payer: BLUE CROSS/BLUE SHIELD | Admitting: Urology

## 2016-03-26 DIAGNOSIS — N200 Calculus of kidney: Secondary | ICD-10-CM

## 2016-03-26 DIAGNOSIS — M549 Dorsalgia, unspecified: Secondary | ICD-10-CM | POA: Diagnosis not present

## 2016-03-28 ENCOUNTER — Other Ambulatory Visit: Payer: Self-pay | Admitting: Urology

## 2016-03-28 DIAGNOSIS — N200 Calculus of kidney: Secondary | ICD-10-CM

## 2016-06-25 ENCOUNTER — Ambulatory Visit (HOSPITAL_COMMUNITY)
Admission: RE | Admit: 2016-06-25 | Discharge: 2016-06-25 | Disposition: A | Discharge status: Home / Self Care | Payer: BLUE CROSS/BLUE SHIELD | Source: Ambulatory Visit | Attending: Urology | Admitting: Urology

## 2016-06-25 ENCOUNTER — Other Ambulatory Visit: Payer: Self-pay | Admitting: Urology

## 2016-06-25 ENCOUNTER — Ambulatory Visit (INDEPENDENT_AMBULATORY_CARE_PROVIDER_SITE_OTHER): Payer: BLUE CROSS/BLUE SHIELD | Admitting: Urology

## 2016-06-25 DIAGNOSIS — N200 Calculus of kidney: Secondary | ICD-10-CM | POA: Insufficient documentation

## 2016-06-25 DIAGNOSIS — N1330 Unspecified hydronephrosis: Secondary | ICD-10-CM | POA: Diagnosis not present

## 2016-06-25 DIAGNOSIS — M549 Dorsalgia, unspecified: Secondary | ICD-10-CM

## 2016-07-02 ENCOUNTER — Other Ambulatory Visit: Payer: Self-pay | Admitting: Urology

## 2016-07-07 ENCOUNTER — Encounter (HOSPITAL_COMMUNITY): Payer: Self-pay | Admitting: *Deleted

## 2016-07-07 NOTE — Progress Notes (Signed)
Pre ESWL phone interview done with wife (Corey Warren)the patient was unavailable and she states he would not be able to call back. History and med list  obtained,updated.   Instructed to bring blue folder,insurance cards,picture ID,designated driver at [id number]  morning of procedure and go to admitting  Reinforced no aspirin(instructions to hold aspirin per your doctor), ibuprofen products 72 hours prior to procedure   Follow laxative instructions provided by urologist (office) in blue folder. Wear easy on/off clothing.leave all other valuables at home. Pt is to take 1/2 dose of his evening insulin night before the procedure,eat a light supper and use his sliding scale for insulin prior to meals as usual/ NPO past MN.  She verbalizes understanding of instructions

## 2016-07-10 ENCOUNTER — Encounter (HOSPITAL_COMMUNITY): Payer: Self-pay

## 2016-07-10 ENCOUNTER — Ambulatory Visit (HOSPITAL_COMMUNITY): Payer: BLUE CROSS/BLUE SHIELD

## 2016-07-10 ENCOUNTER — Ambulatory Visit (HOSPITAL_COMMUNITY)
Admission: RE | Admit: 2016-07-10 | Discharge: 2016-07-10 | Disposition: A | Discharge status: Home / Self Care | Payer: BLUE CROSS/BLUE SHIELD | Source: Ambulatory Visit | Attending: Urology | Admitting: Urology

## 2016-07-10 ENCOUNTER — Encounter (HOSPITAL_COMMUNITY)
Admission: RE | Disposition: A | Discharge status: Home / Self Care | Payer: Self-pay | Source: Ambulatory Visit | Attending: Urology

## 2016-07-10 DIAGNOSIS — I10 Essential (primary) hypertension: Secondary | ICD-10-CM | POA: Diagnosis not present

## 2016-07-10 DIAGNOSIS — Z794 Long term (current) use of insulin: Secondary | ICD-10-CM | POA: Insufficient documentation

## 2016-07-10 DIAGNOSIS — N200 Calculus of kidney: Secondary | ICD-10-CM

## 2016-07-10 DIAGNOSIS — N201 Calculus of ureter: Secondary | ICD-10-CM | POA: Diagnosis not present

## 2016-07-10 DIAGNOSIS — R109 Unspecified abdominal pain: Secondary | ICD-10-CM | POA: Diagnosis present

## 2016-07-10 DIAGNOSIS — E109 Type 1 diabetes mellitus without complications: Secondary | ICD-10-CM | POA: Insufficient documentation

## 2016-07-10 LAB — GLUCOSE, CAPILLARY
GLUCOSE-CAPILLARY: 163 mg/dL — AB (ref 65–99)
GLUCOSE-CAPILLARY: 187 mg/dL — AB (ref 65–99)
GLUCOSE-CAPILLARY: 242 mg/dL — AB (ref 65–99)

## 2016-07-10 SURGERY — LITHOTRIPSY, ESWL
Anesthesia: LOCAL | Laterality: Right

## 2016-07-10 MED ORDER — CIPROFLOXACIN HCL 500 MG PO TABS
500.0000 mg | ORAL_TABLET | ORAL | Status: AC
  Administered 2016-07-10: 500 mg via ORAL
  Filled 2016-07-10: qty 1

## 2016-07-10 MED ORDER — DIAZEPAM 5 MG PO TABS
10.0000 mg | ORAL_TABLET | ORAL | Status: AC
  Administered 2016-07-10: 10 mg via ORAL
  Filled 2016-07-10: qty 2

## 2016-07-10 MED ORDER — SODIUM CHLORIDE 0.9 % IV SOLN
INTRAVENOUS | Status: DC
  Administered 2016-07-10: 08:00:00 via INTRAVENOUS

## 2016-07-10 MED ORDER — ONDANSETRON HCL 4 MG/2ML IJ SOLN
4.0000 mg | Freq: Once | INTRAMUSCULAR | Status: AC
  Administered 2016-07-10: 4 mg via INTRAVENOUS
  Filled 2016-07-10: qty 2

## 2016-07-10 MED ORDER — DIPHENHYDRAMINE HCL 25 MG PO CAPS
25.0000 mg | ORAL_CAPSULE | ORAL | Status: AC
  Administered 2016-07-10: 25 mg via ORAL
  Filled 2016-07-10: qty 1

## 2016-07-25 ENCOUNTER — Ambulatory Visit (HOSPITAL_COMMUNITY)
Admission: RE | Admit: 2016-07-25 | Discharge: 2016-07-25 | Disposition: A | Discharge status: Home / Self Care | Payer: BLUE CROSS/BLUE SHIELD | Source: Ambulatory Visit | Attending: Urology | Admitting: Urology

## 2016-07-25 ENCOUNTER — Other Ambulatory Visit (HOSPITAL_COMMUNITY)
Admission: RE | Admit: 2016-07-25 | Discharge: 2016-07-25 | Disposition: A | Discharge status: Home / Self Care | Payer: BLUE CROSS/BLUE SHIELD | Source: Ambulatory Visit | Attending: Urology | Admitting: Urology

## 2016-07-25 ENCOUNTER — Ambulatory Visit (INDEPENDENT_AMBULATORY_CARE_PROVIDER_SITE_OTHER): Payer: Self-pay | Admitting: Urology

## 2016-07-25 DIAGNOSIS — N200 Calculus of kidney: Secondary | ICD-10-CM

## 2016-08-05 LAB — STONE ANALYSIS
CA PHOS CRY STONE QL IR: 10 %
Ca Oxalate,Dihydrate: 3 %
Ca Oxalate,Monohydr.: 87 %
STONE WEIGHT KSTONE: 4 mg

## 2016-09-17 ENCOUNTER — Ambulatory Visit (INDEPENDENT_AMBULATORY_CARE_PROVIDER_SITE_OTHER): Payer: BLUE CROSS/BLUE SHIELD | Admitting: Urology

## 2016-09-17 ENCOUNTER — Other Ambulatory Visit: Payer: Self-pay | Admitting: Urology

## 2016-09-17 ENCOUNTER — Ambulatory Visit (HOSPITAL_COMMUNITY)
Admission: RE | Admit: 2016-09-17 | Discharge: 2016-09-17 | Disposition: A | Discharge status: Home / Self Care | Payer: BLUE CROSS/BLUE SHIELD | Source: Ambulatory Visit | Attending: Urology | Admitting: Urology

## 2016-09-17 DIAGNOSIS — N200 Calculus of kidney: Secondary | ICD-10-CM | POA: Insufficient documentation

## 2016-09-17 DIAGNOSIS — N201 Calculus of ureter: Secondary | ICD-10-CM | POA: Diagnosis present

## 2016-09-19 ENCOUNTER — Ambulatory Visit (HOSPITAL_COMMUNITY): Payer: BLUE CROSS/BLUE SHIELD

## 2016-11-04 ENCOUNTER — Other Ambulatory Visit: Payer: Self-pay | Admitting: Urology

## 2016-11-04 DIAGNOSIS — N200 Calculus of kidney: Secondary | ICD-10-CM

## 2016-12-17 ENCOUNTER — Ambulatory Visit: Payer: BLUE CROSS/BLUE SHIELD | Admitting: Urology

## 2016-12-19 ENCOUNTER — Ambulatory Visit (HOSPITAL_COMMUNITY)
Admission: RE | Admit: 2016-12-19 | Discharge: 2016-12-19 | Disposition: A | Discharge status: Home / Self Care | Payer: BLUE CROSS/BLUE SHIELD | Source: Ambulatory Visit | Attending: Urology | Admitting: Urology

## 2016-12-19 ENCOUNTER — Ambulatory Visit (HOSPITAL_COMMUNITY): Payer: BLUE CROSS/BLUE SHIELD

## 2016-12-19 DIAGNOSIS — N200 Calculus of kidney: Secondary | ICD-10-CM | POA: Diagnosis not present

## 2017-01-07 ENCOUNTER — Ambulatory Visit (HOSPITAL_COMMUNITY)
Admission: RE | Admit: 2017-01-07 | Discharge: 2017-01-07 | Disposition: A | Discharge status: Home / Self Care | Payer: BLUE CROSS/BLUE SHIELD | Source: Ambulatory Visit | Attending: Urology | Admitting: Urology

## 2017-01-07 ENCOUNTER — Ambulatory Visit (INDEPENDENT_AMBULATORY_CARE_PROVIDER_SITE_OTHER): Payer: BLUE CROSS/BLUE SHIELD | Admitting: Urology

## 2017-01-07 ENCOUNTER — Other Ambulatory Visit: Payer: Self-pay | Admitting: Urology

## 2017-01-07 DIAGNOSIS — N200 Calculus of kidney: Secondary | ICD-10-CM | POA: Diagnosis not present

## 2017-01-22 DIAGNOSIS — I10 Essential (primary) hypertension: Secondary | ICD-10-CM | POA: Insufficient documentation

## 2017-01-22 DIAGNOSIS — N200 Calculus of kidney: Secondary | ICD-10-CM | POA: Insufficient documentation

## 2017-01-22 DIAGNOSIS — Z8673 Personal history of transient ischemic attack (TIA), and cerebral infarction without residual deficits: Secondary | ICD-10-CM

## 2017-01-22 DIAGNOSIS — E785 Hyperlipidemia, unspecified: Secondary | ICD-10-CM | POA: Insufficient documentation

## 2017-01-22 DIAGNOSIS — J45909 Unspecified asthma, uncomplicated: Secondary | ICD-10-CM | POA: Insufficient documentation

## 2017-01-22 DIAGNOSIS — Z720 Tobacco use: Secondary | ICD-10-CM

## 2017-01-22 DIAGNOSIS — E139 Other specified diabetes mellitus without complications: Secondary | ICD-10-CM | POA: Insufficient documentation

## 2017-01-22 DIAGNOSIS — E039 Hypothyroidism, unspecified: Secondary | ICD-10-CM | POA: Insufficient documentation

## 2017-01-22 HISTORY — DX: Tobacco use: Z72.0

## 2017-01-22 HISTORY — DX: Personal history of transient ischemic attack (TIA), and cerebral infarction without residual deficits: Z86.73

## 2017-01-23 ENCOUNTER — Ambulatory Visit (INDEPENDENT_AMBULATORY_CARE_PROVIDER_SITE_OTHER): Payer: BLUE CROSS/BLUE SHIELD | Admitting: Family Medicine

## 2017-01-23 ENCOUNTER — Encounter: Payer: Self-pay | Admitting: Family Medicine

## 2017-01-23 DIAGNOSIS — E109 Type 1 diabetes mellitus without complications: Secondary | ICD-10-CM | POA: Diagnosis not present

## 2017-01-23 DIAGNOSIS — E785 Hyperlipidemia, unspecified: Secondary | ICD-10-CM

## 2017-01-23 DIAGNOSIS — E039 Hypothyroidism, unspecified: Secondary | ICD-10-CM | POA: Diagnosis not present

## 2017-01-23 DIAGNOSIS — I10 Essential (primary) hypertension: Secondary | ICD-10-CM | POA: Diagnosis not present

## 2017-01-23 DIAGNOSIS — N200 Calculus of kidney: Secondary | ICD-10-CM | POA: Diagnosis not present

## 2017-01-23 DIAGNOSIS — E139 Other specified diabetes mellitus without complications: Secondary | ICD-10-CM

## 2017-01-23 NOTE — Progress Notes (Signed)
Chief Complaint  Patient presents with  . Establish Care   New to establish  Diabetic under care endocrinology at [facility] those notes are available Eye exam - 2016 Podiatry - none, denies foot issue Denies kidney involvement ACE - lisinopril Statin - lIpitor No known cardiovascular disease Primary care notes requested [place] at age 66 and 72, due at 64 due to polyps Has an abdominal ultrasound for AA Has had flu, tetanus, pneumonia and shingles shots Discussed lung cancer screening due to 30+ years of smoking 1+ppd.      Patient Active Problem List   Diagnosis Date Noted  . LADA (latent autoimmune diabetes in adults), managed as type 1 ([place]) 01/22/2017  . Essential hypertension 01/22/2017  . HLD (hyperlipidemia) 01/22/2017  . Hypothyroidism 01/22/2017  . Asthma 01/22/2017  . Nephrolithiasis 01/22/2017    Outpatient Encounter Prescriptions as of 01/23/2017  Medication Sig  . aspirin EC 81 MG tablet Take 81 mg by mouth daily.  Marland Kitchen atorvastatin (LIPITOR) 10 MG tablet Take 10 mg by mouth daily.  . insulin aspart (NOVOLOG) 100 UNIT/ML injection Inject 14-28 Units into the skin 3 (three) times daily with meals. 28 units in the morning and 14 units at lunch 28 units before dinner. Sliding scale: 201-250=2u 251-300=4u 301-350=6u 351-400=8u >400=10u  . insulin glargine (LANTUS) 100 UNIT/ML injection Inject 110,800 Units into the skin daily.   Marland Kitchen levothyroxine (SYNTHROID, LEVOTHROID) 25 MCG tablet Take 25-50 mcg by mouth daily before breakfast. Takes one tablet Mon-Fri. Takes 2 tablets on Sat and Sun.  Marland Kitchen lisinopril (PRINIVIL,ZESTRIL) 5 MG tablet Take 5 mg by mouth daily.   No facility-administered encounter medications on file as of 01/23/2017.     Past Medical History:  Diagnosis Date  . Acute renal failure ([place]) 10/22/2014  . Allergy    seasonal  . Arthritis    shoulders  . Asthma    as a child  . Bell's palsy   . Complication of anesthesia    pt sts he woke up during  gallbladder surgery also.  . Diabetes mellitus without complication ([place])   . History of kidney stones   . History of stroke 01/22/2017  . Hyperlipidemia   . Hypertension   . Hypothyroidism   . PONV (postoperative nausea and vomiting)   . Renal disorder   . Thyroid disease   . Tobacco abuse 01/22/2017    Past Surgical History:  Procedure Laterality Date  . CHOLECYSTECTOMY     pt states, "I woke up on the table when I had my gallbladder".  . CIRCUMCISION    . COLONOSCOPY    . CYSTOSCOPY W/ URETERAL STENT PLACEMENT Right 01/30/2016   Procedure: CYSTOSCOPY WITH STENT REPLACEMENT;  Surgeon: Cleon Gustin, MD;  Location: [facility];  Service: Urology;  Laterality: Right;  . CYSTOSCOPY WITH STENT PLACEMENT Right 01/16/2016   Procedure: CYSTOSCOPY WITH RIGHT STENT PLACEMENT;  Surgeon: Cleon Gustin, MD;  Location: [facility];  Service: Urology;  Laterality: Right;  . CYSTOSCOPY/RETROGRADE/URETEROSCOPY/STONE EXTRACTION WITH BASKET Right 01/16/2016   Procedure: CYSTOSCOPY/BILATERAL RETROGRADE/BILATERAL URETEROSCOPY;  Surgeon: Cleon Gustin, MD;  Location: [facility];  Service: Urology;  Laterality: Right;  . CYSTOSCOPY/RETROGRADE/URETEROSCOPY/STONE EXTRACTION WITH BASKET Right 01/30/2016   Procedure: cystoscopy, right retrrograde, right stone extraction with basket and laser, right stent exchange;  Surgeon: Cleon Gustin, MD;  Location: [facility];  Service: Urology;  Laterality: Right;  . HOLMIUM LASER APPLICATION Right 3/71/0626   Procedure: HOLMIUM LASER APPLICATION;  Surgeon: Cleon Gustin, MD;  Location: [facility];  Service: Urology;  Laterality: Right;    Social History   Social History  . Marital status: Married    Spouse name: Benjamine Mola  . Number of children: 0  . Years of education: 14   Occupational History  . 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 Smoking status: Former Smoker    Packs/day: 2.00    Years: 30.00     Types: Cigarettes    Quit date: 01/11/2016  . Smokeless tobacco: Never Used  . Alcohol use No  . Drug use: No  . Sexual activity: Yes    Birth control/ protection: None   Other Topics Concern  . 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 Arthritis Mother   . Diabetes Mother   . Hyperlipidemia Mother   . Hypertension Mother   . Cancer Mother     pancreatic  . Cancer Father     lung  . Arthritis Father   . Parkinson's disease Brother     Review of Systems  Constitutional: Negative for chills, fever and weight loss.  HENT: Negative for congestion and hearing loss.   Eyes: Negative for blurred vision and pain.  Respiratory: Negative for cough and shortness of breath.   Cardiovascular: Negative for chest pain and leg swelling.  Gastrointestinal: Negative for abdominal pain, constipation, diarrhea and heartburn.  Genitourinary: Negative for dysuria and frequency.  Musculoskeletal: Negative for falls, joint pain and myalgias.  Neurological: Negative for dizziness, seizures and headaches.  Psychiatric/Behavioral: Negative for depression. The patient is not nervous/anxious and does not have insomnia.   no complaints  BP 140/84 (BP Location: Right Arm, Patient Position: Sitting, Cuff Size: Normal)   Pulse 100   Temp 97.4 F (36.3 C) (Temporal)   Resp 18   Ht 5\' 11"  (1.803 m)   Wt 200 lb 1.9 oz (90.8 kg)   SpO2 96%   BMI 27.91 kg/m   Physical Exam  Constitutional: He is oriented to person, place, and time. He appears well-developed and well-nourished.  HENT:  Head: Normocephalic and atraumatic.  Right Ear: External ear normal.  Left Ear: External ear normal.  Mouth/Throat: Oropharynx is clear and moist.  Eyes: Conjunctivae are normal. Pupils are equal, round, and reactive to light.  Neck:  Normal range of motion. Neck supple. No thyromegaly present.  Cardiovascular: Normal rate, regular rhythm, normal heart sounds and intact distal pulses.   Pulmonary/Chest: Effort normal and breath sounds normal. No respiratory distress.  Abdominal: Soft. Bowel sounds are normal.  Musculoskeletal: Normal range of motion. He exhibits no edema.  Lymphadenopathy:    He has no cervical adenopathy.  Neurological: He is alert and oriented to person, place, and time. He displays normal reflexes.  Gait normal  Skin: Skin is warm and dry.  Psychiatric: He has a normal mood and affect. His behavior is normal.  Bland affect.  Little face expression.  Seems to have short term memory problems, asks wife a lot.  Nursing note and vitals reviewed.  ASSESSMENT/PLAN:  1. LADA (latent autoimmune diabetes in adults), managed as type 1 ([place]) Under care endocrinology  2. Essential hypertension controlled  3. Hyperlipidemia,  unspecified hyperlipidemia type On statin  4. Hypothyroidism, unspecified type On synthroid  5. Nephrolithiasis Recent - removed   Patient Instructions  I am pleased that you quit smoking Continue to walk every day that you are able You are due for an eye exam No change in medicines See me in 6 months Call sooner for problems    Raylene Everts, MD

## 2017-01-23 NOTE — Patient Instructions (Addendum)
I am pleased that you quit smoking Continue to walk every day that you are able You are due for an eye exam No change in medicines See me in 6 months Call sooner for problems

## 2017-02-19 ENCOUNTER — Emergency Department (HOSPITAL_COMMUNITY)
Admission: EM | Admit: 2017-02-19 | Discharge: 2017-02-19 | Disposition: A | Discharge status: Home / Self Care | Payer: BLUE CROSS/BLUE SHIELD | Attending: Emergency Medicine | Admitting: Emergency Medicine

## 2017-02-19 ENCOUNTER — Encounter (HOSPITAL_COMMUNITY): Payer: Self-pay | Admitting: *Deleted

## 2017-02-19 DIAGNOSIS — M25462 Effusion, left knee: Secondary | ICD-10-CM | POA: Insufficient documentation

## 2017-02-19 DIAGNOSIS — Z794 Long term (current) use of insulin: Secondary | ICD-10-CM | POA: Insufficient documentation

## 2017-02-19 DIAGNOSIS — Z87891 Personal history of nicotine dependence: Secondary | ICD-10-CM | POA: Diagnosis not present

## 2017-02-19 DIAGNOSIS — Z79899 Other long term (current) drug therapy: Secondary | ICD-10-CM | POA: Insufficient documentation

## 2017-02-19 DIAGNOSIS — Z7982 Long term (current) use of aspirin: Secondary | ICD-10-CM | POA: Diagnosis not present

## 2017-02-19 DIAGNOSIS — J45909 Unspecified asthma, uncomplicated: Secondary | ICD-10-CM | POA: Diagnosis not present

## 2017-02-19 DIAGNOSIS — I10 Essential (primary) hypertension: Secondary | ICD-10-CM | POA: Insufficient documentation

## 2017-02-19 DIAGNOSIS — E119 Type 2 diabetes mellitus without complications: Secondary | ICD-10-CM | POA: Insufficient documentation

## 2017-02-19 DIAGNOSIS — G510 Bell's palsy: Secondary | ICD-10-CM | POA: Insufficient documentation

## 2017-02-19 DIAGNOSIS — E039 Hypothyroidism, unspecified: Secondary | ICD-10-CM | POA: Diagnosis not present

## 2017-02-19 DIAGNOSIS — M7989 Other specified soft tissue disorders: Secondary | ICD-10-CM | POA: Diagnosis present

## 2017-02-19 LAB — SYNOVIAL CELL COUNT + DIFF, W/ CRYSTALS
Crystals, Fluid: NONE SEEN
EOSINOPHILS-SYNOVIAL: 0 % (ref 0–1)
Lymphocytes-Synovial Fld: 4 % (ref 0–20)
MONOCYTE-MACROPHAGE-SYNOVIAL FLUID: 4 % — AB (ref 50–90)
Neutrophil, Synovial: 92 % — ABNORMAL HIGH (ref 0–25)
WBC, SYNOVIAL: 2145 /mm3 — AB (ref 0–200)

## 2017-02-19 LAB — CBC WITH DIFFERENTIAL/PLATELET
BASOS ABS: 0 10*3/uL (ref 0.0–0.1)
BASOS PCT: 0 %
EOS ABS: 0.1 10*3/uL (ref 0.0–0.7)
Eosinophils Relative: 1 %
HCT: 39.8 % (ref 39.0–52.0)
Hemoglobin: 13.5 g/dL (ref 13.0–17.0)
Lymphocytes Relative: 16 %
Lymphs Abs: 1.3 10*3/uL (ref 0.7–4.0)
MCH: 30.3 pg (ref 26.0–34.0)
MCHC: 33.9 g/dL (ref 30.0–36.0)
MCV: 89.4 fL (ref 78.0–100.0)
Monocytes Absolute: 0.9 10*3/uL (ref 0.1–1.0)
Monocytes Relative: 11 %
NEUTROS PCT: 72 %
Neutro Abs: 5.7 10*3/uL (ref 1.7–7.7)
Platelets: 201 10*3/uL (ref 150–400)
RBC: 4.45 MIL/uL (ref 4.22–5.81)
RDW: 12.9 % (ref 11.5–15.5)
WBC: 7.9 10*3/uL (ref 4.0–10.5)

## 2017-02-19 LAB — COMPREHENSIVE METABOLIC PANEL
ALT: 10 U/L — ABNORMAL LOW (ref 17–63)
AST: 13 U/L — ABNORMAL LOW (ref 15–41)
Albumin: 3.6 g/dL (ref 3.5–5.0)
Alkaline Phosphatase: 114 U/L (ref 38–126)
Anion gap: 9 (ref 5–15)
BUN: 14 mg/dL (ref 6–20)
CHLORIDE: 102 mmol/L (ref 101–111)
CO2: 24 mmol/L (ref 22–32)
Calcium: 9.2 mg/dL (ref 8.9–10.3)
Creatinine, Ser: 1.21 mg/dL (ref 0.61–1.24)
Glucose, Bld: 413 mg/dL — ABNORMAL HIGH (ref 65–99)
POTASSIUM: 3.7 mmol/L (ref 3.5–5.1)
SODIUM: 135 mmol/L (ref 135–145)
Total Bilirubin: 0.6 mg/dL (ref 0.3–1.2)
Total Protein: 7 g/dL (ref 6.5–8.1)

## 2017-02-19 LAB — ETHANOL

## 2017-02-19 LAB — URINALYSIS, ROUTINE W REFLEX MICROSCOPIC
BILIRUBIN URINE: NEGATIVE
Bacteria, UA: NONE SEEN
Ketones, ur: NEGATIVE mg/dL
LEUKOCYTES UA: NEGATIVE
NITRITE: NEGATIVE
PROTEIN: NEGATIVE mg/dL
Specific Gravity, Urine: 1.024 (ref 1.005–1.030)
Squamous Epithelial / LPF: NONE SEEN
pH: 5 (ref 5.0–8.0)

## 2017-02-19 LAB — TROPONIN I

## 2017-02-19 LAB — PROTEIN, PLEURAL OR PERITONEAL FLUID: TOTAL PROTEIN, FLUID: 4.5 g/dL

## 2017-02-19 LAB — GRAM STAIN

## 2017-02-19 LAB — GLUCOSE, BODY FLUID OTHER: Glucose, Body Fluid Other: 291

## 2017-02-19 LAB — LIPASE, BLOOD: LIPASE: 12 U/L (ref 11–51)

## 2017-02-19 LAB — BRAIN NATRIURETIC PEPTIDE: B NATRIURETIC PEPTIDE 5: 124 pg/mL — AB (ref 0.0–100.0)

## 2017-02-19 MED ORDER — FENTANYL CITRATE (PF) 100 MCG/2ML IJ SOLN
50.0000 ug | Freq: Once | INTRAMUSCULAR | Status: AC
  Administered 2017-02-19: 50 ug via INTRAVENOUS
  Filled 2017-02-19: qty 2

## 2017-02-19 MED ORDER — HYDROCODONE-ACETAMINOPHEN 5-325 MG PO TABS
1.0000 | ORAL_TABLET | Freq: Four times a day (QID) | ORAL | Status: DC | PRN
  Administered 2017-02-19: 1 via ORAL
  Filled 2017-02-19: qty 1

## 2017-02-19 MED ORDER — KETOROLAC TROMETHAMINE 30 MG/ML IJ SOLN
15.0000 mg | Freq: Once | INTRAMUSCULAR | Status: AC
  Administered 2017-02-19: 15 mg via INTRAMUSCULAR
  Filled 2017-02-19: qty 1

## 2017-02-19 MED ORDER — PREDNISONE 10 MG PO TABS
60.0000 mg | ORAL_TABLET | ORAL | Status: AC
  Administered 2017-02-19: 22:00:00 60 mg via ORAL
  Filled 2017-02-19: qty 1

## 2017-02-19 MED ORDER — LIDOCAINE HCL (PF) 1 % IJ SOLN
5.0000 mL | Freq: Once | INTRAMUSCULAR | Status: AC
  Administered 2017-02-19: 5 mL via INTRADERMAL
  Filled 2017-02-19: qty 5

## 2017-02-19 MED ORDER — HYDROCODONE-ACETAMINOPHEN 5-325 MG PO TABS: 1.0000 | ORAL_TABLET | Freq: Four times a day (QID) | ORAL | 0 refills | Status: DC | PRN

## 2017-02-19 MED ORDER — PREDNISONE 20 MG PO TABS: 40.0000 mg | ORAL_TABLET | Freq: Every day | ORAL | 0 refills | Status: DC

## 2017-02-19 NOTE — ED Provider Notes (Signed)
[facility] DEPT Provider Note   CSN: [id number] Arrival date & time: 02/19/17  1733     History   Chief Complaint Chief Complaint  Patient presents with  . Leg Swelling    HPI Corey Warren is a 66 y.o. male.  HPI Patient presents with concern of new left leg pain, swelling. Patient acknowledges multiple medical issues including poorly controlled insulin-dependent diabetes. Patient was well yesterday, but notes over the past day his total increasing swelling throughout the leg, with focal pain in the knee. He states that the ankle also hurts, though the pain is more severe in the knee, with any motion of the ankle, any weightbearing. No appreciable superficial changes. No fever, no chills, no nausea, no vomiting. He denies a history of gout. No history of intervention of the knee. Today the patient was actually at a scheduled endocrinology visit, and after describing his leg pain to the endocrinologist, he was referred to the emergency department at that facility. He and his wife chose to leave that facility and come here for evaluation.  Past Medical History:  Diagnosis Date  . Acute renal failure ([place]) 10/22/2014  . Allergy    seasonal  . Arthritis    shoulders  . Asthma    as a child  . Bell's palsy   . Complication of anesthesia    pt sts he woke up during gallbladder surgery also.  . Diabetes mellitus without complication ([place])   . History of kidney stones   . History of stroke 01/22/2017  . Hyperlipidemia   . Hypertension   . Hypothyroidism   . PONV (postoperative nausea and vomiting)   . Renal disorder   . Thyroid disease   . Tobacco abuse 01/22/2017    Patient Active Problem List   Diagnosis Date Noted  . LADA (latent autoimmune diabetes in adults), managed as type 1 ([place]) 01/22/2017  . Essential hypertension 01/22/2017  . HLD (hyperlipidemia) 01/22/2017  . Hypothyroidism 01/22/2017  . Asthma 01/22/2017  . Nephrolithiasis 01/22/2017    Past Surgical  History:  Procedure Laterality Date  . CHOLECYSTECTOMY     pt states, "I woke up on the table when I had my gallbladder".  . CIRCUMCISION    . COLONOSCOPY    . CYSTOSCOPY W/ URETERAL STENT PLACEMENT Right 01/30/2016   Procedure: CYSTOSCOPY WITH STENT REPLACEMENT;  Surgeon: Cleon Gustin, MD;  Location: [facility];  Service: Urology;  Laterality: Right;  . CYSTOSCOPY WITH STENT PLACEMENT Right 01/16/2016   Procedure: CYSTOSCOPY WITH RIGHT STENT PLACEMENT;  Surgeon: Cleon Gustin, MD;  Location: [facility];  Service: Urology;  Laterality: Right;  . CYSTOSCOPY/RETROGRADE/URETEROSCOPY/STONE EXTRACTION WITH BASKET Right 01/16/2016   Procedure: CYSTOSCOPY/BILATERAL RETROGRADE/BILATERAL URETEROSCOPY;  Surgeon: Cleon Gustin, MD;  Location: [facility];  Service: Urology;  Laterality: Right;  . CYSTOSCOPY/RETROGRADE/URETEROSCOPY/STONE EXTRACTION WITH BASKET Right 01/30/2016   Procedure: cystoscopy, right retrrograde, right stone extraction with basket and laser, right stent exchange;  Surgeon: Cleon Gustin, MD;  Location: [facility];  Service: Urology;  Laterality: Right;  . HOLMIUM LASER APPLICATION Right 3/79/0240   Procedure: HOLMIUM LASER APPLICATION;  Surgeon: Cleon Gustin, MD;  Location: [facility];  Service: Urology;  Laterality: Right;       Home Medications    Prior to Admission medications   Medication Sig Start Date End Date Taking? Authorizing Provider  aspirin EC 81 MG tablet Take 81 mg by mouth daily.    [provider]  atorvastatin (LIPITOR) 10 MG tablet Take  10 mg by mouth daily.    [provider]  insulin aspart (NOVOLOG) 100 UNIT/ML injection Inject 14-28 Units into the skin 3 (three) times daily with meals. 28 units in the morning and 14 units at lunch 28 units before dinner. Sliding scale: 201-250=2u 251-300=4u 301-350=6u 351-400=8u >400=10u    [provider]  insulin glargine (LANTUS) 100 UNIT/ML injection Inject 110,800 Units into the  skin daily.     [provider]  levothyroxine (SYNTHROID, LEVOTHROID) 25 MCG tablet Take 25-50 mcg by mouth daily before breakfast. Takes one tablet Mon-Fri. Takes 2 tablets on Sat and Sun.    [provider]  lisinopril (PRINIVIL,ZESTRIL) 5 MG tablet Take 5 mg by mouth daily.    [provider]    Family History Family History  Problem Relation Age of Onset  . Arthritis Mother   . Diabetes Mother   . Hyperlipidemia Mother   . Hypertension Mother   . Cancer Mother        pancreatic  . Cancer Father        lung  . Arthritis Father   . Parkinson's disease Brother     Social History Social History  Substance Use Topics  . Smoking status: Former Smoker    Packs/day: 2.00    Years: 30.00    Types: Cigarettes    Quit date: 01/11/2016  . Smokeless tobacco: Never Used  . Alcohol use No     Allergies   Codeine and Morphine and related   Review of Systems Review of Systems  Constitutional:       Per HPI, otherwise negative  HENT:       Per HPI, otherwise negative  Respiratory:       Per HPI, otherwise negative  Cardiovascular:       Per HPI, otherwise negative  Gastrointestinal: Negative for vomiting.  Endocrine:       Negative aside from HPI  Genitourinary:       Neg aside from HPI   Musculoskeletal:       Per HPI, otherwise negative  Skin: Negative.   Neurological: Negative for syncope.     Physical Exam Updated Vital Signs BP (!) 142/86   Pulse (!) 109   Temp 98 F (36.7 C) (Oral)   Resp 20   Ht 5' 11.5" (1.816 m)   Wt 90.7 kg (200 lb)   SpO2 97%   BMI 27.51 kg/m   Physical Exam  Constitutional: He is oriented to person, place, and time. He appears well-developed. No distress.  HENT:  Head: Normocephalic and atraumatic.  Eyes: Conjunctivae and EOM are normal.  Cardiovascular: Normal rate and regular rhythm.   Pulmonary/Chest: Effort normal. No stridor. No respiratory distress.  Abdominal: He exhibits no distension.    Musculoskeletal: He exhibits no edema.       Left hip: Normal.       Left knee: He exhibits decreased range of motion, swelling and effusion. He exhibits no ecchymosis, no deformity, no laceration, no erythema, normal alignment and no LCL laxity. Tenderness found.  Swelling throughout the left lower extremity, primarily from the knee inferiorly to the ankle. No gross deformity ankle, and the patient flexes and extends the ankle spontaneously, without apparent discomfort, but with some referred pain in the knee.   Neurological: He is alert and oriented to person, place, and time.  Skin: Skin is warm and dry.  Psychiatric: He has a normal mood and affect.  Nursing note and vitals reviewed.  ED Treatments / Results  Labs (all labs ordered are listed, but only abnormal results are displayed) Labs Reviewed  COMPREHENSIVE METABOLIC PANEL - Abnormal; Notable for the following:       Result Value   Glucose, Bld 413 (*)    AST 13 (*)    ALT 10 (*)    All other components within normal limits  BRAIN NATRIURETIC PEPTIDE - Abnormal; Notable for the following:    B Natriuretic Peptide 124.0 (*)    All other components within normal limits  URINALYSIS, ROUTINE W REFLEX MICROSCOPIC - Abnormal; Notable for the following:    Color, Urine STRAW (*)    Glucose, UA >=500 (*)    Hgb urine dipstick SMALL (*)    All other components within normal limits  SYNOVIAL CELL COUNT + DIFF, W/ CRYSTALS - Abnormal; Notable for the following:    Color, Synovial YELLOW (*)    Appearance-Synovial HAZY (*)    WBC, Synovial 2,145 (*)    Neutrophil, Synovial 92 (*)    Monocyte-Macrophage-Synovial Fluid 4 (*)    All other components within normal limits  GRAM STAIN  CULTURE, BODY FLUID-BOTTLE  ETHANOL  LIPASE, BLOOD  TROPONIN I  CBC WITH DIFFERENTIAL/PLATELET  PROTEIN, PLEURAL OR PERITONEAL FLUID  PROTEIN, SYNOVIAL FLUID  GLUCOSE, SYNOVIAL FLUID  GLUCOSE, BODY FLUID OTHER     Procedures .Joint Aspiration/Arthrocentesis Date/Time: 02/19/2017 7:03 PM Performed by: Carmin Muskrat Authorized by: Carmin Muskrat   Consent:    Consent obtained:  Verbal   Consent given by:  Patient   Risks discussed:  Infection, pain and incomplete drainage   Alternatives discussed:  No treatment and alternative treatment Location:    Location:  Knee   Knee:  L knee Anesthesia (see MAR for exact dosages):    Anesthesia method:  Local infiltration   Local anesthetic:  Lidocaine 1% w/o epi Procedure details:    Preparation: Patient was prepped and draped in usual sterile fashion     Needle gauge:  18 G   Ultrasound guidance: no     Approach:  Medial   Aspirate characteristics:  Yellow   Steroid injected: no     Specimen collected: yes   Post-procedure details:    Dressing:  Adhesive bandage   Patient tolerance of procedure:  Tolerated well, no immediate complications   (including critical care time)  Medications Ordered in ED Medications  predniSONE (DELTASONE) tablet 60 mg (not administered)  ketorolac (TORADOL) 30 MG/ML injection 15 mg (not administered)  HYDROcodone-acetaminophen (NORCO/VICODIN) 5-325 MG per tablet 1 tablet (not administered)  lidocaine (PF) (XYLOCAINE) 1 % injection 5 mL (5 mLs Intradermal Given by Other 02/19/17 1916)  fentaNYL (SUBLIMAZE) injection 50 mcg (50 mcg Intravenous Given 02/19/17 2005)     Initial Impression / Assessment and Plan / ED Course  I have reviewed the triage vital signs and the nursing notes.  Pertinent labs & imaging results that were available during my care of the patient were reviewed by me and considered in my medical decision making (see chart for details).  9:59 PM On repeat exam the patient is awake and alert. The knee appears diminished in size, the patient agrees That his leg in general appear smaller, he is comfortable. Initial findings notable for white count of 2000, not consistent with septic  arthritis. Concern for pseudogout versus gout, initial crystal studies not available, there are some the lab delay, and this may not be available for some time. I discussed this with the patient and  his wife, and given the reassuring findings, absence of evidence for septic arthritis, treatment will be initiated for inflammation, and they will check for final results, discuss these with her physician tomorrow. Given his improvement, absence of evidence for septic arthritis, no evidence for bacteremia, sepsis, hemodynamic stability, or substantial lab abnormalities, patient discharged in stable condition.  Final Clinical Impressions(s) / ED Diagnoses   Final diagnoses:  Knee effusion, left    New Prescriptions New Prescriptions   HYDROCODONE-ACETAMINOPHEN (NORCO/VICODIN) 5-325 MG TABLET    Take 1 tablet by mouth every 6 (six) hours as needed for severe pain.   PREDNISONE (DELTASONE) 20 MG TABLET    Take 2 tablets (40 mg total) by mouth daily with breakfast. For the next four days     Carmin Muskrat, MD 02/19/17 2200

## 2017-02-19 NOTE — Discharge Instructions (Signed)
As discussed, you have been diagnosed with a left knee effusion. You have either gout or pseudogout, and confirmatory results will be available via my chart, in the next 24 hours. It is important to take all medication as directed, monitor your condition carefully, and follow-up with your physician.

## 2017-02-19 NOTE — ED Triage Notes (Signed)
Left knee pain and swelling

## 2017-02-24 LAB — CULTURE, BODY FLUID-BOTTLE: CULTURE: NO GROWTH

## 2017-02-24 LAB — CULTURE, BODY FLUID W GRAM STAIN -BOTTLE

## 2017-06-02 ENCOUNTER — Other Ambulatory Visit: Payer: Self-pay | Admitting: Urology

## 2017-06-02 DIAGNOSIS — N200 Calculus of kidney: Secondary | ICD-10-CM

## 2017-07-08 ENCOUNTER — Ambulatory Visit: Payer: BLUE CROSS/BLUE SHIELD | Admitting: Urology

## 2017-07-24 ENCOUNTER — Ambulatory Visit: Payer: BLUE CROSS/BLUE SHIELD | Admitting: Family Medicine

## 2017-10-10 IMAGING — CT CT RENAL STONE PROTOCOL
2 of 4 series · 16 of 46 positions shown, 18 images · non-contrast
Comparison: None.

CLINICAL DATA: Difficulty urinating this morning with bilateral
flank pain.

EXAM:
CT ABDOMEN AND PELVIS WITHOUT CONTRAST
TECHNIQUE: Multidetector CT imaging of the abdomen and pelvis was performed
following the standard protocol without IV contrast.

[Series 2: standard/full over (age)lbs 5.0 · axial · 0.80mm/px · z∈[-602,-166]mm · 13 of 95 slices shown, 15 images]
[im 4/95  soft-tissue]
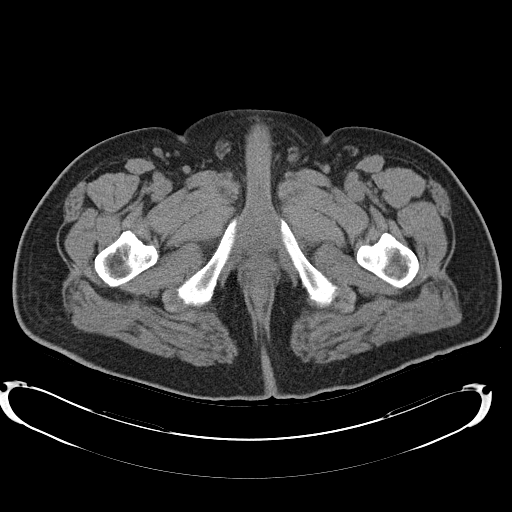
[im 4/95  bone]
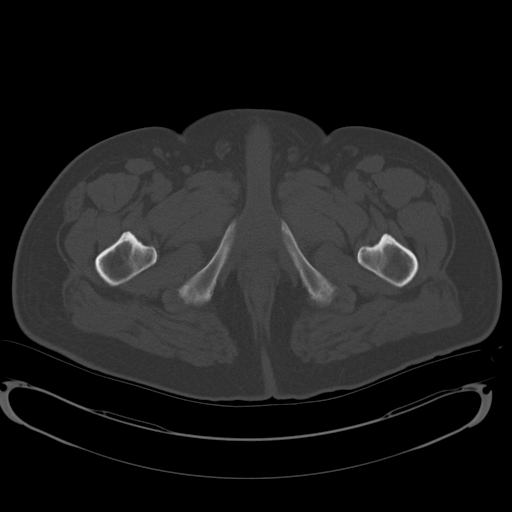
[im 12/95  soft-tissue]
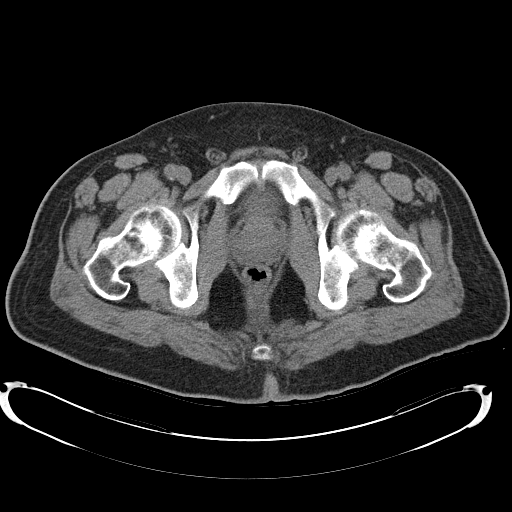
[im 20/95  soft-tissue]
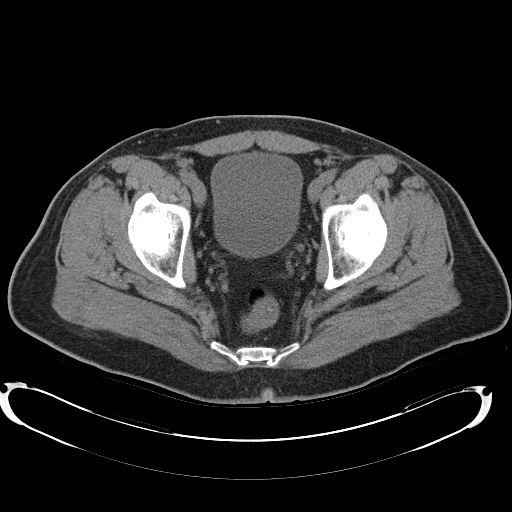
[im 28/95  soft-tissue]
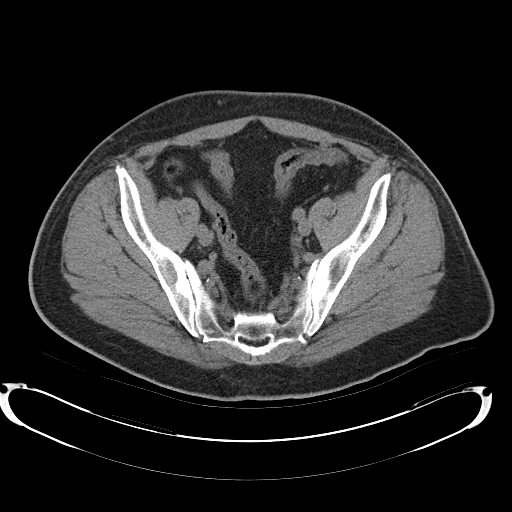
[im 32/95  soft-tissue]
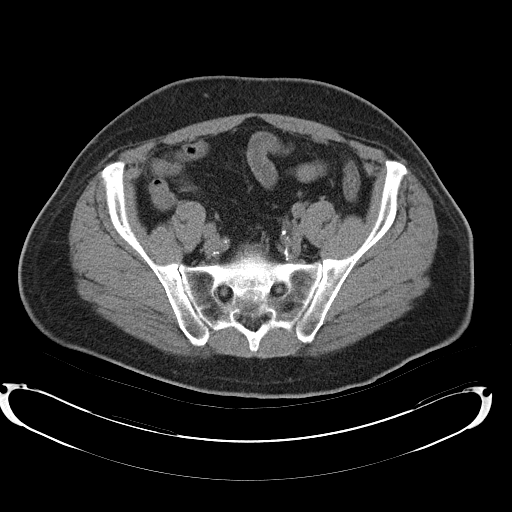
[im 40/95  soft-tissue]
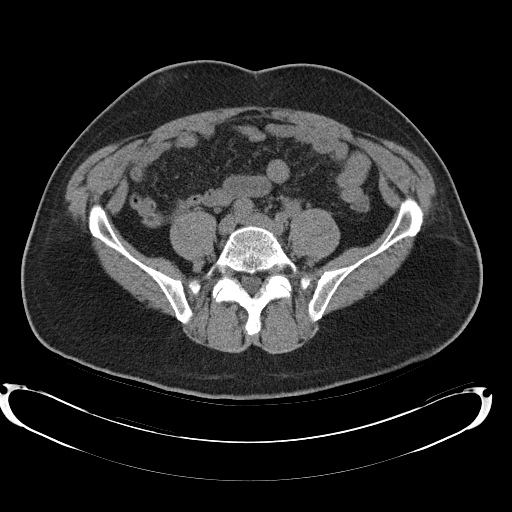
[im 48/95  soft-tissue]
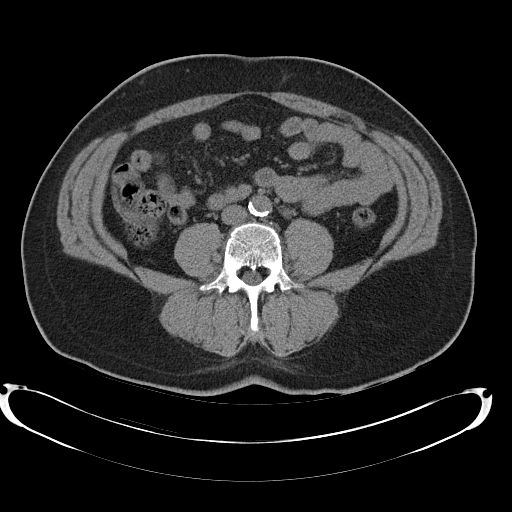
[im 55/95  soft-tissue]
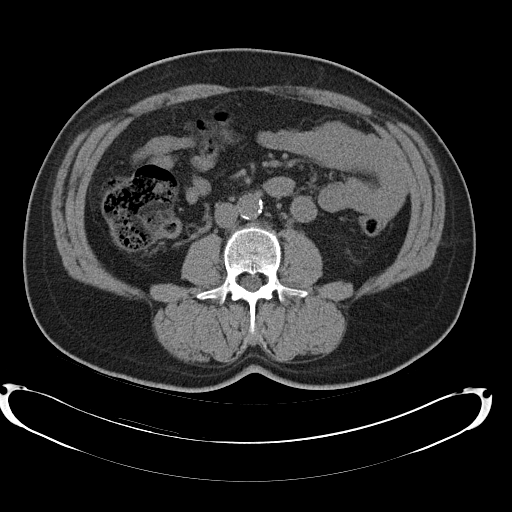
[im 63/95  soft-tissue]
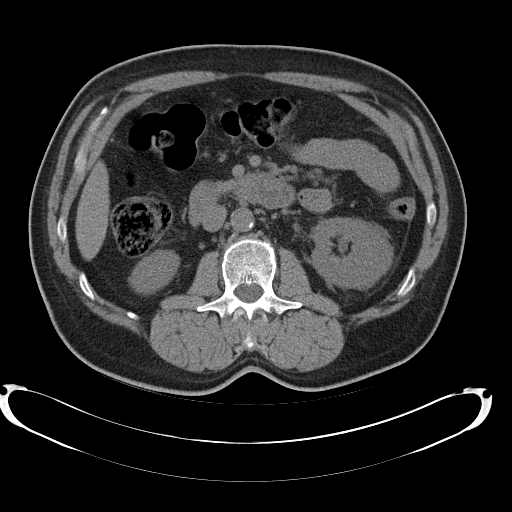
[im 63/95  bone]
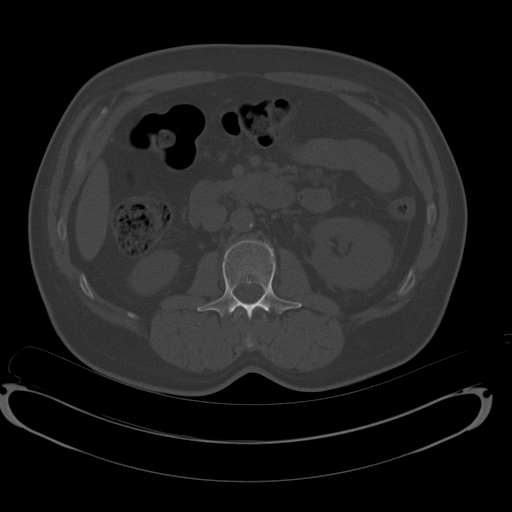
[im 67/95  soft-tissue]
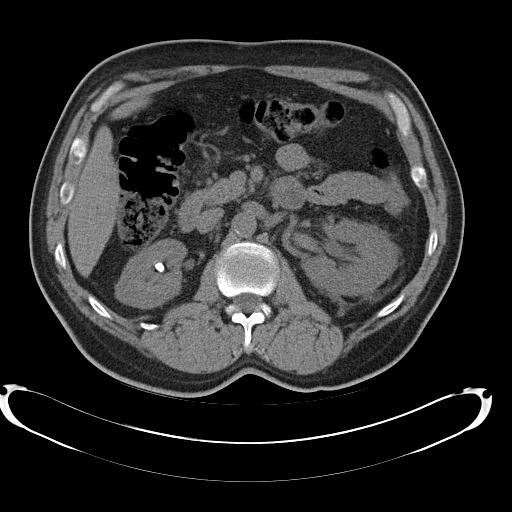
[im 75/95  soft-tissue]
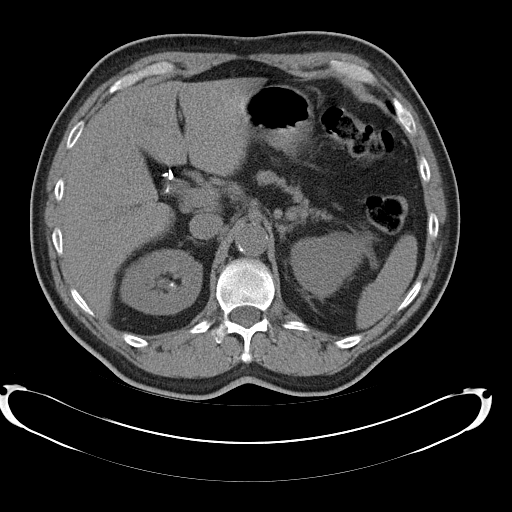
[im 83/95  soft-tissue]
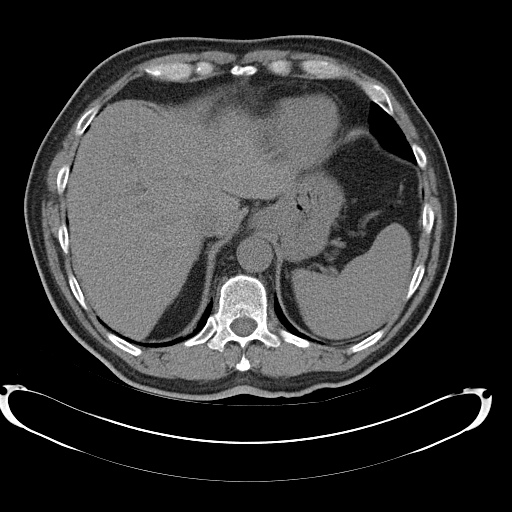
[im 91/95  soft-tissue]
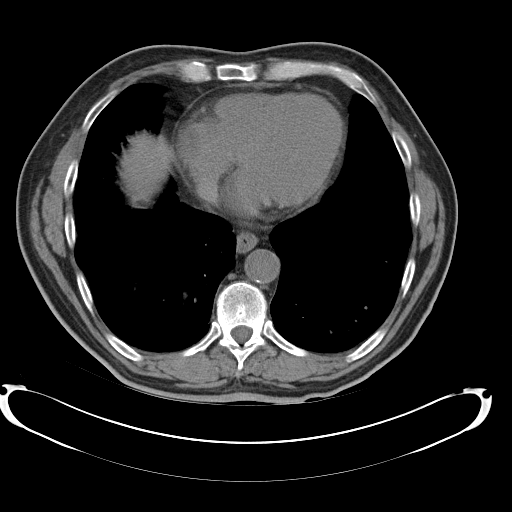

[Series 3: mpr coronal · coronal · 0.82mm/px · 3 of 102 slices shown]
[im 34/102  soft-tissue]
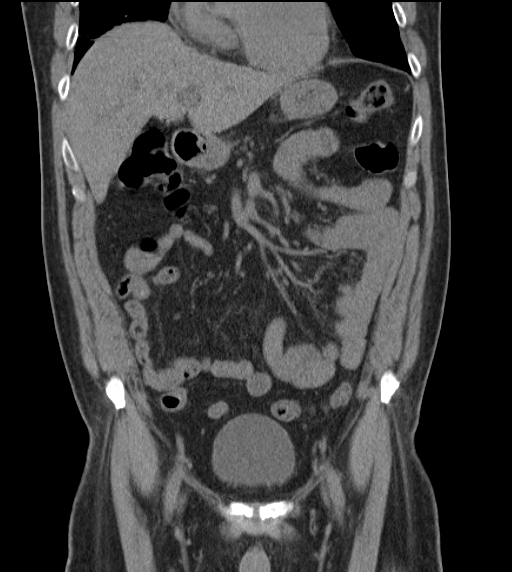
[im 45/102  soft-tissue]
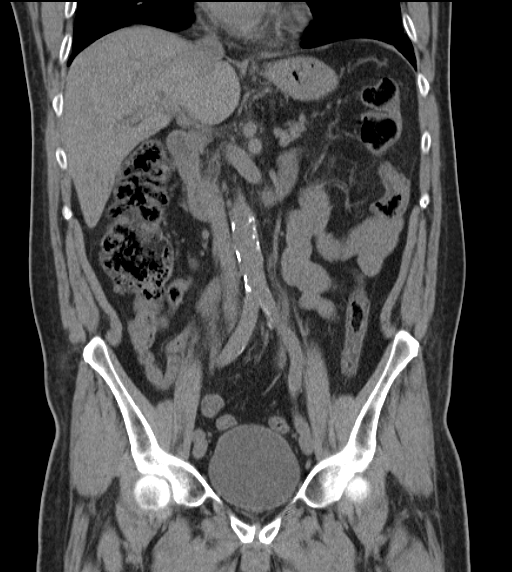
[im 57/102  soft-tissue]
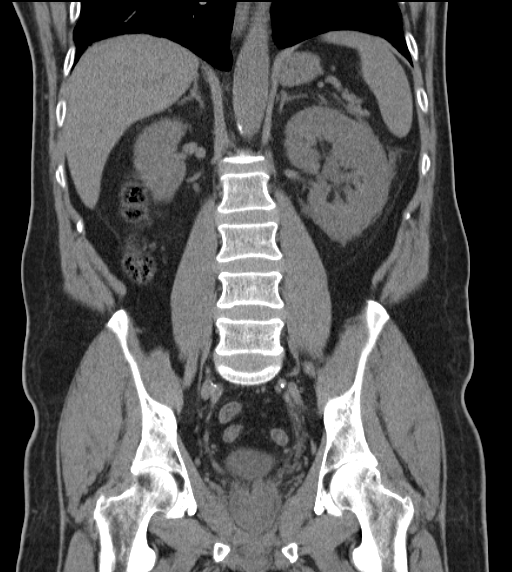

[16 of 46 positions shown; findings below may reference images not displayed]

FINDINGS: Lower chest: Hazy ground-glass densities along the dependent aspects
of both lower lobes, left side greater than right. Findings could
represent areas of atelectasis or possibly chronic changes. No large
pleural effusions.

Hepatobiliary: Gallbladder has been removed. Normal appearance of
the liver on this non contrast examination.

Pancreas: Normal appearance of the pancreas without inflammation or
duct dilatation.

Spleen: Normal appearance of the spleen without enlargement.

Adrenals/Urinary Tract: Normal adrenal glands. Three large stones in
the right kidney, largest measures 1.4 cm in the lower pole.
Negative for right hydronephrosis and no right ureter stones. There
is a 0.3 cm stone at the left ureterovesical junction. Mild
dilatation of the left ureter with surrounding edema. There is left
perinephric edema. Mild dilatation of left renal calices. No stones
within the left kidney.

Stomach/Bowel: Normal appearance of stomach and duodenum. Normal
appearance of the small and large bowel without obstruction.
Moderate amount of stool in the right colon.

Vascular/Lymphatic: Atherosclerotic calcifications in the abdominal
aorta without aneurysm. No suspicious lymphadenopathy in the abdomen
or pelvis.

Reproductive: Normal appearance of the prostate and seminal
vesicles.

Other: No free fluid.  No free air.

Musculoskeletal: Mild compression deformity of the T12 superior
endplate. Vacuum disc at T11-T12. No significant soft tissue
swelling around the T12 vertebral body.
IMPRESSION: Mild left hydroureteronephrosis due to a 3 mm stone at the left
ureterovesical junction. There is left perinephric stranding and
stranding around the left ureter.

Nonobstructive right kidney stones.

T12 compression fracture of unknown age.

Dependent densities at both lung bases. Findings may represent
atelectasis.

## 2017-11-30 ENCOUNTER — Encounter: Payer: Self-pay | Admitting: Family Medicine

## 2018-03-21 ENCOUNTER — Emergency Department (HOSPITAL_COMMUNITY): Payer: BLUE CROSS/BLUE SHIELD

## 2018-03-21 ENCOUNTER — Emergency Department (HOSPITAL_COMMUNITY)
Admission: EM | Admit: 2018-03-21 | Discharge: 2018-03-21 | Disposition: A | Discharge status: Home / Self Care | Payer: BLUE CROSS/BLUE SHIELD | Attending: Emergency Medicine | Admitting: Emergency Medicine

## 2018-03-21 ENCOUNTER — Encounter (HOSPITAL_COMMUNITY): Payer: Self-pay | Admitting: Emergency Medicine

## 2018-03-21 ENCOUNTER — Other Ambulatory Visit: Payer: Self-pay

## 2018-03-21 DIAGNOSIS — E119 Type 2 diabetes mellitus without complications: Secondary | ICD-10-CM | POA: Insufficient documentation

## 2018-03-21 DIAGNOSIS — Z79899 Other long term (current) drug therapy: Secondary | ICD-10-CM | POA: Diagnosis not present

## 2018-03-21 DIAGNOSIS — Z794 Long term (current) use of insulin: Secondary | ICD-10-CM | POA: Insufficient documentation

## 2018-03-21 DIAGNOSIS — I10 Essential (primary) hypertension: Secondary | ICD-10-CM | POA: Diagnosis not present

## 2018-03-21 DIAGNOSIS — M10071 Idiopathic gout, right ankle and foot: Secondary | ICD-10-CM | POA: Diagnosis not present

## 2018-03-21 DIAGNOSIS — Z7982 Long term (current) use of aspirin: Secondary | ICD-10-CM | POA: Diagnosis not present

## 2018-03-21 DIAGNOSIS — Z87891 Personal history of nicotine dependence: Secondary | ICD-10-CM | POA: Diagnosis not present

## 2018-03-21 DIAGNOSIS — E039 Hypothyroidism, unspecified: Secondary | ICD-10-CM | POA: Diagnosis not present

## 2018-03-21 DIAGNOSIS — J45909 Unspecified asthma, uncomplicated: Secondary | ICD-10-CM | POA: Insufficient documentation

## 2018-03-21 DIAGNOSIS — R2241 Localized swelling, mass and lump, right lower limb: Secondary | ICD-10-CM | POA: Diagnosis present

## 2018-03-21 DIAGNOSIS — M109 Gout, unspecified: Secondary | ICD-10-CM

## 2018-03-21 LAB — CBG MONITORING, ED: GLUCOSE-CAPILLARY: 281 mg/dL — AB (ref 70–99)

## 2018-03-21 MED ORDER — COLCHICINE 0.6 MG PO TABS
0.6000 mg | ORAL_TABLET | Freq: Once | ORAL | Status: AC
  Administered 2018-03-21: 0.6 mg via ORAL
  Filled 2018-03-21: qty 1

## 2018-03-21 MED ORDER — HYDROCODONE-ACETAMINOPHEN 5-325 MG PO TABS
2.0000 | ORAL_TABLET | Freq: Once | ORAL | Status: AC
  Administered 2018-03-21: 2 via ORAL
  Filled 2018-03-21: qty 2

## 2018-03-21 MED ORDER — HYDROCODONE-ACETAMINOPHEN 5-325 MG PO TABS: 1.0000 | ORAL_TABLET | ORAL | 0 refills | Status: AC | PRN

## 2018-03-21 MED ORDER — ONDANSETRON HCL 4 MG PO TABS
4.0000 mg | ORAL_TABLET | Freq: Once | ORAL | Status: AC
  Administered 2018-03-21: 4 mg via ORAL
  Filled 2018-03-21: qty 1

## 2018-03-21 MED ORDER — COLCHICINE 0.6 MG PO TABS: 0.6000 mg | ORAL_TABLET | Freq: Every day | ORAL | 0 refills | Status: AC

## 2018-03-21 MED ORDER — DEXAMETHASONE SODIUM PHOSPHATE 10 MG/ML IJ SOLN
10.0000 mg | Freq: Once | INTRAMUSCULAR | Status: AC
  Administered 2018-03-21: 10 mg via INTRAMUSCULAR
  Filled 2018-03-21: qty 1

## 2018-03-21 NOTE — ED Notes (Signed)
Hx gout Pain and swelling since yesterday   Pain meds with relief  Needs MD referral- reports no PCP

## 2018-03-21 NOTE — ED Notes (Signed)
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

## 2018-03-21 NOTE — Discharge Instructions (Addendum)
Your x-ray shows multiple areas of arthritis, there is no fracture or dislocation appreciated about your x-ray.  Your vital signs within normal limits.  You were treated in the emergency department with steroid injection, pain medication, and a gout medication.  A prescription for pain medication and gout medication have been written.  Please elevate your foot is much as possible.  Please call Dr. Maudie Mercury, or the physicians at the [facility] clinic to see if you can establish with them as a primary physician.

## 2018-03-21 NOTE — ED Triage Notes (Signed)
Patient c/o right foot swelling that started Thursday. Denies any injury. Patient did state some pain in the beginning. Per wife patient took hydrocodone yesterday with relief. Denies any shortness of breath or hx of CHF. Per patient hx of gout.

## 2018-03-21 NOTE — ED Notes (Signed)
Delay explained to pt after spouse expressed concern that pt has yet to be seen  Call to FT to ascertain if they will see

## 2018-03-21 NOTE — ED Notes (Signed)
Due to multiple emergent patients, and pt's prolonged wait, call to FT for query if they would evaluate due to pt and spouse increasing discomfort, and concern of wait without being seen. Explained that at this time, multiple emergencies in dept and call to FT to ascertain if they would eval  HB, PA and DM, RN, agree to see- pt is moved to FT at this time for eval

## 2018-03-21 NOTE — ED Provider Notes (Signed)
[facility] EMERGENCY DEPARTMENT Provider Note   CSN: [id number] Arrival date & time: 03/21/18  1110     History   Chief Complaint Chief Complaint  Patient presents with  . Leg Swelling    HPI Corey Warren is a 67 y.o. male.  Patient is a 67 year old male who presents to the emergency department with a complaint of leg swelling.  Patient states that his right foot started swelling about 3 days ago.  It the pain and swelling of gotten progressively worse.  The patient states that he has not had any recent injury or trauma.  He tried hydrocodone on yesterday, and it gave him some relief.  Patient has not had any recent operation or procedure on the right foot.  No other swelling reported.  No fever or chills reported.  Patient states he has a history of arthritis and a gouty arthritis.  He also states that he has diabetes and his wife states that he has been diagnosed with neuropathies.  He presents to the emergency department now for assistance with this issue.   The history is provided by the patient and the spouse.    Past Medical History:  Diagnosis Date  . Acute renal failure ([place]) 10/22/2014  . Allergy    seasonal  . Arthritis    shoulders  . Asthma    as a child  . Bell's palsy   . Complication of anesthesia    pt sts he woke up during gallbladder surgery also.  . Diabetes mellitus without complication ([place])   . History of kidney stones   . History of stroke 01/22/2017  . Hyperlipidemia   . Hypertension   . Hypothyroidism   . PONV (postoperative nausea and vomiting)   . Renal disorder   . Thyroid disease   . Tobacco abuse 01/22/2017    Patient Active Problem List   Diagnosis Date Noted  . 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 Essential hypertension 01/22/2017  . HLD (hyperlipidemia) 01/22/2017  . Hypothyroidism 01/22/2017  . Asthma 01/22/2017  . Nephrolithiasis 01/22/2017    Past Surgical History:  Procedure  Laterality Date  . CHOLECYSTECTOMY     pt states, "I woke up on the table when I had my gallbladder".  . CIRCUMCISION    . COLONOSCOPY    . CYSTOSCOPY W/ URETERAL STENT PLACEMENT Right 01/30/2016   Procedure: CYSTOSCOPY WITH STENT REPLACEMENT;  Surgeon: Cleon Gustin, MD;  Location: [facility];  Service: Urology;  Laterality: Right;  . CYSTOSCOPY WITH STENT PLACEMENT Right 01/16/2016   Procedure: CYSTOSCOPY WITH RIGHT STENT PLACEMENT;  Surgeon: Cleon Gustin, MD;  Location: [facility];  Service: Urology;  Laterality: Right;  . CYSTOSCOPY/RETROGRADE/URETEROSCOPY/STONE EXTRACTION WITH BASKET Right 01/16/2016   Procedure: CYSTOSCOPY/BILATERAL RETROGRADE/BILATERAL URETEROSCOPY;  Surgeon: Cleon Gustin, MD;  Location: [facility];  Service: Urology;  Laterality: Right;  . CYSTOSCOPY/RETROGRADE/URETEROSCOPY/STONE EXTRACTION WITH BASKET Right 01/30/2016   Procedure: cystoscopy, right retrrograde, right stone extraction with basket and laser, right stent exchange;  Surgeon: Cleon Gustin, MD;  Location: [facility];  Service: Urology;  Laterality: Right;  . HOLMIUM LASER APPLICATION Right 3/54/6568   Procedure: HOLMIUM LASER APPLICATION;  Surgeon: Cleon Gustin, MD;  Location: [facility];  Service: Urology;  Laterality: Right;        Home Medications    Prior to Admission medications   Medication Sig Start Date End Date Taking? Authorizing Provider  amLODipine (NORVASC) 5 MG tablet Take 5  mg by mouth daily.   Yes [provider]  aspirin EC 81 MG tablet Take 81 mg by mouth daily.   Yes [provider]  atorvastatin (LIPITOR) 20 MG tablet Take 20 mg by mouth daily.   Yes [provider]  HYDROcodone-acetaminophen (NORCO/VICODIN) 5-325 MG tablet Take 1 tablet by mouth every 6 (six) hours as needed for severe pain. 02/19/17  Yes Carmin Muskrat, MD  insulin glargine (LANTUS) 100 UNIT/ML injection Inject 22 Units into the skin daily.    Yes [provider]  insulin  lispro (HUMALOG) 100 UNIT/ML injection Inject 6 Units into the skin 3 (three) times daily before meals.   Yes [provider]  levothyroxine (SYNTHROID, LEVOTHROID) 50 MCG tablet Take 50 mcg by mouth daily before breakfast.   Yes [provider]  lisinopril (PRINIVIL,ZESTRIL) 10 MG tablet Take 5 mg by mouth daily.   Yes [provider]  vitamin B-12 (CYANOCOBALAMIN) 1000 MCG tablet Take 1,000 mcg by mouth daily.    Yes [provider]    Family History Family History  Problem Relation Age of Onset  . Arthritis Mother   . Diabetes Mother   . Hyperlipidemia Mother   . Hypertension Mother   . Cancer Mother        pancreatic  . Cancer Father        lung  . Arthritis Father   . Parkinson's disease Brother     Social History Social History   Tobacco Use  . Smoking status: Former Smoker    Packs/day: 2.00    Years: 30.00    Pack years: 60.00    Types: Cigarettes    Last attempt to quit: 01/11/2016    Years since quitting: 2.1  . Smokeless tobacco: Never Used  Substance Use Topics  . Alcohol use: No  . Drug use: No     Allergies   Codeine and Morphine and related   Review of Systems Review of Systems  Constitutional: Negative for activity change.       All ROS Neg except as noted in HPI  HENT: Negative for nosebleeds.   Eyes: Negative for photophobia and discharge.  Respiratory: Negative for cough, shortness of breath and wheezing.   Cardiovascular: Negative for chest pain and palpitations.  Gastrointestinal: Negative for abdominal pain and blood in stool.  Genitourinary: Negative for dysuria, frequency and hematuria.  Musculoskeletal: Negative for arthralgias, back pain and neck pain.       Foot pain  Skin: Negative.   Neurological: Negative for dizziness, seizures and speech difficulty.  Psychiatric/Behavioral: Negative for confusion and hallucinations.     Physical Exam Updated Vital Signs BP (!) 178/92 (BP Location: Left  Arm)   Pulse (!) 119   Temp 98.1 F (36.7 C) (Oral)   Resp 16   Ht 5\' 10"  (1.778 m)   Wt 92.1 kg (203 lb)   SpO2 94%   BMI 29.13 kg/m   Physical Exam  Constitutional: He is oriented to person, place, and time. He appears well-developed and well-nourished.  Non-toxic appearance.  HENT:  Head: Normocephalic.  Right Ear: Tympanic membrane and external ear normal.  Left Ear: Tympanic membrane and external ear normal.  Eyes: Pupils are equal, round, and reactive to light. EOM and lids are normal.  Neck: Normal range of motion. Neck supple. Carotid bruit is not present.  Cardiovascular: Normal rate, regular rhythm, normal heart sounds, intact distal pulses and normal pulses.  Pulmonary/Chest: Breath sounds normal. No respiratory distress.  Abdominal: Soft. Bowel sounds are normal. There is no tenderness. There is no guarding.  Musculoskeletal: Normal range of motion.       Feet:  There is mild puffiness of the dorsum of the right foot.  There is pain at the MP joint area.  This pain moves into the medial plantar arch area.  The Achilles tendon is intact.  The dorsalis pedis pulse is 2+.  Lymphadenopathy:       Head (right side): No submandibular adenopathy present.       Head (left side): No submandibular adenopathy present.    He has no cervical adenopathy.  Neurological: He is alert and oriented to person, place, and time. He has normal strength. No cranial nerve deficit or sensory deficit.  Skin: Skin is warm and dry.  Psychiatric: He has a normal mood and affect. His speech is normal.  Nursing note and vitals reviewed.    ED Treatments / Results  Labs (all labs ordered are listed, but only abnormal results are displayed) Labs Reviewed  CBG MONITORING, ED - Abnormal; Notable for the following components:      Result Value   Glucose-Capillary 281 (*)    All other components within normal limits    EKG None  Radiology Dg Foot Complete Right  Result Date:  03/21/2018 CLINICAL DATA:  Right foot pain and swelling with no known injury, started on Thursday EXAM: RIGHT FOOT COMPLETE - 3+ VIEW COMPARISON:  None. FINDINGS: First MTP joint dorsal spurring. Negative for fracture or erosion. Negative soft tissues. IMPRESSION: No acute finding Electronically Signed   By: Monte Fantasia M.D.   On: 03/21/2018 12:38    Procedures Procedures (including critical care time)  Medications Ordered in ED Medications - No data to display   Initial Impression / Assessment and Plan / ED Course  I have reviewed the triage vital signs and the nursing notes.  Pertinent labs & imaging results that were available during my care of the patient were reviewed by me and considered in my medical decision making (see chart for details).       Final Clinical Impressions(s) / ED Diagnoses MDM Patient was brought to room 23 after having been in the acute care area because of increase in the number of high acuity patients.  Upon my arrival to the room, the patient's wife asked me if I was in the [employer], and that if I was not in the [employer] that I could not examine the patient.  I informed the patient that I was not sure of what that works our group was in, but she could check with the registration officials and they could perhaps help her.  The husband then told the spouse that he wanted to get some help and he wanted some help now and to sit down and be quiet.  After my examination Patient was given medication for pain, and an x-ray was obtained.  X-ray revealed spurring at the first MTP on the right foot.  No fracture, no dislocation, no foreign body noted.  I relayed the information to the patient and his spouse.  Patient is beginning to feel some better after medication.  Patient no longer has a primary care physician, the patient was referred to the [facility]. I discussed the prescriptions with the patient and the  patient's spouse in terms of which they understand.  They will return to the emergency department if there are emergent changes in  condition, problems, or concerns.   Final diagnoses:  Gout, arthritis    ED Discharge Orders        Ordered    HYDROcodone-acetaminophen (NORCO/VICODIN) 5-325 MG tablet  Every 4 hours PRN     03/21/18 1444    colchicine 0.6 MG tablet  Daily     03/21/18 1444       Lily Kocher, PA-C 03/23/18 1645    Daleen Bo, MD 03/23/18 2245

## 2023-08-23 DEATH — deceased
